# Patient Record
Sex: Female | Born: 1960 | Race: Black or African American | Hispanic: No | Smoking: Never smoker
Health system: Southern US, Community
[De-identification: ages and names within clinical notes are randomized; demographics above are authoritative.]

## PROBLEM LIST (undated history)

## (undated) DIAGNOSIS — I10 Essential (primary) hypertension: Secondary | ICD-10-CM

## (undated) HISTORY — PX: DILATION AND CURETTAGE OF UTERUS: SHX78

## (undated) HISTORY — PX: TONSILLECTOMY: SUR1361

---

## 2014-09-07 ENCOUNTER — Emergency Department (HOSPITAL_BASED_OUTPATIENT_CLINIC_OR_DEPARTMENT_OTHER): Payer: BLUE CROSS/BLUE SHIELD

## 2014-09-07 ENCOUNTER — Other Ambulatory Visit: Payer: Self-pay

## 2014-09-07 ENCOUNTER — Encounter (HOSPITAL_BASED_OUTPATIENT_CLINIC_OR_DEPARTMENT_OTHER): Payer: Self-pay

## 2014-09-07 ENCOUNTER — Emergency Department (HOSPITAL_BASED_OUTPATIENT_CLINIC_OR_DEPARTMENT_OTHER)
Admission: EM | Admit: 2014-09-07 | Discharge: 2014-09-07 | Disposition: A | Discharge status: Home / Self Care | Payer: BLUE CROSS/BLUE SHIELD | Attending: Emergency Medicine | Admitting: Emergency Medicine

## 2014-09-07 DIAGNOSIS — E669 Obesity, unspecified: Secondary | ICD-10-CM | POA: Diagnosis not present

## 2014-09-07 DIAGNOSIS — R0789 Other chest pain: Secondary | ICD-10-CM | POA: Insufficient documentation

## 2014-09-07 DIAGNOSIS — R079 Chest pain, unspecified: Secondary | ICD-10-CM | POA: Diagnosis present

## 2014-09-07 HISTORY — DX: Essential (primary) hypertension: I10

## 2014-09-07 LAB — CBC WITH DIFFERENTIAL/PLATELET
BASOS PCT: 1 % (ref 0–1)
Basophils Absolute: 0 10*3/uL (ref 0.0–0.1)
EOS ABS: 0.7 10*3/uL (ref 0.0–0.7)
Eosinophils Relative: 13 % — ABNORMAL HIGH (ref 0–5)
HCT: 37.1 % (ref 36.0–46.0)
HEMOGLOBIN: 12.3 g/dL (ref 12.0–15.0)
LYMPHS ABS: 1.6 10*3/uL (ref 0.7–4.0)
LYMPHS PCT: 31 % (ref 12–46)
MCH: 29.5 pg (ref 26.0–34.0)
MCHC: 33.2 g/dL (ref 30.0–36.0)
MCV: 89 fL (ref 78.0–100.0)
MONO ABS: 0.4 10*3/uL (ref 0.1–1.0)
Monocytes Relative: 8 % (ref 3–12)
NEUTROS PCT: 47 % (ref 43–77)
Neutro Abs: 2.4 10*3/uL (ref 1.7–7.7)
PLATELETS: 285 10*3/uL (ref 150–400)
RBC: 4.17 MIL/uL (ref 3.87–5.11)
RDW: 12.3 % (ref 11.5–15.5)
WBC: 5.1 10*3/uL (ref 4.0–10.5)

## 2014-09-07 LAB — BASIC METABOLIC PANEL
ANION GAP: 5 (ref 5–15)
BUN: 15 mg/dL (ref 6–20)
CALCIUM: 8.8 mg/dL — AB (ref 8.9–10.3)
CO2: 27 mmol/L (ref 22–32)
CREATININE: 0.69 mg/dL (ref 0.44–1.00)
Chloride: 107 mmol/L (ref 101–111)
GFR calc non Af Amer: >60 mL/min (ref >60)
Glucose, Bld: 107 mg/dL — ABNORMAL HIGH (ref 65–99)
POTASSIUM: 3.9 mmol/L (ref 3.5–5.1)
Sodium: 139 mmol/L (ref 135–145)

## 2014-09-07 LAB — TROPONIN I
Troponin I: <0.03 ng/mL (ref <0.031)
Troponin I: <0.03 ng/mL (ref <0.031)

## 2014-09-07 NOTE — ED Notes (Signed)
MD at bedside. 

## 2014-09-07 NOTE — Discharge Instructions (Signed)
Chest Pain (Nonspecific) Call your primary care physician to schedule a follow-up visit within the next one or 2 weeks. Return if concerned for any reason It is often hard to give a diagnosis for the cause of chest pain. There is always a chance that your pain could be related to something serious, such as a heart attack or a blood clot in the lungs. You need to follow up with your doctor. HOME CARE  If antibiotic medicine was given, take it as directed by your doctor. Finish the medicine even if you start to feel better.  For the next few days, avoid activities that bring on chest pain. Continue physical activities as told by your doctor.  Do not use any tobacco products. This includes cigarettes, chewing tobacco, and e-cigarettes.  Avoid drinking alcohol.  Only take medicine as told by your doctor.  Follow your doctor's suggestions for more testing if your chest pain does not go away.  Keep all doctor visits you made. GET HELP IF:  Your chest pain does not go away, even after treatment.  You have a rash with blisters on your chest.  You have a fever. GET HELP RIGHT AWAY IF:   You have more pain or pain that spreads to your arm, neck, jaw, back, or belly (abdomen).  You have shortness of breath.  You cough more than usual or cough up blood.  You have very bad back or belly pain.  You feel sick to your stomach (nauseous) or throw up (vomit).  You have very bad weakness.  You pass out (faint).  You have chills. This is an emergency. Do not wait to see if the problems will go away. Call your local emergency services (911 in [place] Do not drive yourself to the hospital. MAKE SURE YOU:   Understand these instructions.  Will watch your condition.  Will get help right away if you are not doing well or get worse. Document Released: 09/11/2007 Document Revised: 03/30/2013 Document Reviewed: 09/11/2007 [facility] Patient Information 2015 [place], [place]. This information is  not intended to replace advice given to you by your health care provider. Make sure you discuss any questions you have with your health care provider.

## 2014-09-07 NOTE — ED Provider Notes (Signed)
CSN: [id number]     Arrival date & time 09/07/14  [id number] History   First MD Initiated Contact with Patient 09/07/14 0935     Chief Complaint  Patient presents with  . Chest Pain     (Consider location/radiation/quality/duration/timing/severity/associated sxs/prior Treatment) HPI Complains of anterior chest pain rating to left arm onset 4 days ago, intermittent  But lasting all day yesterday. Awakened with pain this morning. But presently pain-free. She treated herself with aspirin, and TUMS, no relief. Pain is worse with stress. Nonexertional.. No other associated symptoms no associated shortness of breath no nausea no sweatiness. Presently feels a mild vague discomfort at substernal area which has been constant since awakening this morning No past medical history on file. No past surgical history on file. past medical history hypertension No family history on file. History  Substance Use Topics  . Smoking status: Not on file  . Smokeless tobacco: Not on file  . Alcohol Use: Not on file   ex-smoker quit 25 years ago no alcohol no drugs OB History    No data available     Review of Systems  Constitutional: Negative.   HENT: Negative.   Respiratory: Negative.   Cardiovascular: Positive for chest pain.  Gastrointestinal: Negative.   Musculoskeletal: Negative.   Skin: Negative.   Neurological: Negative.   Psychiatric/Behavioral: Negative.   All other systems reviewed and are negative.     Allergies  Review of patient's allergies indicates not on file.  Home Medications   Prior to Admission medications   Not on File   There were no vitals taken for this visit. Physical Exam  Constitutional: She appears well-developed and well-nourished.  HENT:  Head: Normocephalic and atraumatic.  Eyes: Conjunctivae are normal. Pupils are equal, round, and reactive to light.  Neck: Neck supple. No tracheal deviation present. No thyromegaly present.  Cardiovascular: Normal rate and  regular rhythm.   No murmur heard. Pulmonary/Chest: Effort normal and breath sounds normal.  Abdominal: Soft. Bowel sounds are normal. She exhibits no distension. There is no tenderness.  Obese  Musculoskeletal: Normal range of motion. She exhibits no edema or tenderness.  Neurological: She is alert. Coordination normal.  Skin: Skin is warm and dry. No rash noted.  Psychiatric: She has a normal mood and affect.  Nursing note and vitals reviewed.   ED Course  Procedures (including critical care time) Labs Review Labs Reviewed - No data to display  Imaging Review No results found.   EKG Interpretation   Date/Time:  Wednesday September 07 2014 09:41:44 EDT Ventricular Rate:  93 PR Interval:  146 QRS Duration: 86 QT Interval:  364 QTC Calculation: 452 R Axis:   21 Text Interpretation:  Normal sinus rhythm Normal ECG No old tracing to  compare Confirmed by Ethelda ChickJACUBOWITZ  MD, Greta Yung [phone]([id number]) on 09/07/2014 9:47:19 AM     1 PM continues to feel a mild vague substernal discomfort which she states has been present since awakening this morning. She declines pain medicine and states she is reasonably comfortable. Chest x-ray viewed by me. Results for orders placed or performed during the hospital encounter of 09/07/14  Troponin I  Result Value Ref Range   Troponin I <0.03 <0.031 ng/mL  Troponin I  Result Value Ref Range   Troponin I <0.03 <0.031 ng/mL  CBC with Differential/Platelet  Result Value Ref Range   WBC 5.1 4.0 - 10.5 K/uL   RBC 4.17 3.87 - 5.11 MIL/uL   Hemoglobin 12.3 12.0 - 15.0 g/dL  HCT 37.1 36.0 - 46.0 %   MCV 89.0 78.0 - 100.0 fL   MCH 29.5 26.0 - 34.0 pg   MCHC 33.2 30.0 - 36.0 g/dL   RDW 04.5 40.9 - 81.1 %   Platelets 285 150 - 400 K/uL   Neutrophils Relative % 47 43 - 77 %   Neutro Abs 2.4 1.7 - 7.7 K/uL   Lymphocytes Relative 31 12 - 46 %   Lymphs Abs 1.6 0.7 - 4.0 K/uL   Monocytes Relative 8 3 - 12 %   Monocytes Absolute 0.4 0.1 - 1.0 K/uL   Eosinophils  Relative 13 (H) 0 - 5 %   Eosinophils Absolute 0.7 0.0 - 0.7 K/uL   Basophils Relative 1 0 - 1 %   Basophils Absolute 0.0 0.0 - 0.1 K/uL  Basic metabolic panel  Result Value Ref Range   Sodium 139 135 - 145 mmol/L   Potassium 3.9 3.5 - 5.1 mmol/L   Chloride 107 101 - 111 mmol/L   CO2 27 22 - 32 mmol/L   Glucose, Bld 107 (H) 65 - 99 mg/dL   BUN 15 6 - 20 mg/dL   Creatinine, Ser 9.14 0.44 - 1.00 mg/dL   Calcium 8.8 (L) 8.9 - 10.3 mg/dL   GFR calc non Af Amer >60 >60 mL/min   GFR calc Af Amer >60 >60 mL/min   Anion gap 5 5 - 15   Dg Chest 2 View  09/07/2014   CLINICAL DATA:  Mid to LEFT side chest pain since 09/03/2014, history hypertension  EXAM: CHEST  2 VIEW  COMPARISON:  None  FINDINGS: Upper normal heart size.  Normal mediastinal contours and pulmonary vascularity.  Central peribronchial thickening.  No infiltrate, pleural effusion or pneumothorax.  Bones unremarkable.  IMPRESSION: Bronchitic changes without infiltrate.   Electronically Signed   By: Ulyses Southward M.D.   On: 09/07/2014 10:01    MDM  Pain felt to be atypical for acute coronary syndrome. Heart score equals 2 based on age and risk factors Final diagnoses:  None   plan follow-up with PMD Diagnosis atypical chest pain      Doug Sou, MD 09/07/14 1311

## 2014-09-07 NOTE — ED Notes (Signed)
Midsternal chest pain that developed Saturday and relieved after taking 2 doses of Aspirin.  States she has had intermittent chest pain since and chest pain awakened ger this am.  Pain is described as "indigestion, and radiates to left arm.  Took Tums and Pepto Bismal without relief.  Denies SOB, diaphoresis, dizziness, nausea/vomiting.  No pain at present time, however 6/10 at the worst.

## 2015-06-07 DIAGNOSIS — I10 Essential (primary) hypertension: Secondary | ICD-10-CM

## 2015-06-07 HISTORY — DX: Essential (primary) hypertension: I10

## 2015-07-17 DIAGNOSIS — E559 Vitamin D deficiency, unspecified: Secondary | ICD-10-CM

## 2015-07-17 HISTORY — DX: Vitamin D deficiency, unspecified: E55.9

## 2016-03-25 DIAGNOSIS — E782 Mixed hyperlipidemia: Secondary | ICD-10-CM | POA: Insufficient documentation

## 2016-03-25 HISTORY — DX: Mixed hyperlipidemia: E78.2

## 2016-05-18 ENCOUNTER — Encounter (HOSPITAL_BASED_OUTPATIENT_CLINIC_OR_DEPARTMENT_OTHER): Payer: Self-pay | Admitting: *Deleted

## 2016-05-18 ENCOUNTER — Emergency Department (HOSPITAL_BASED_OUTPATIENT_CLINIC_OR_DEPARTMENT_OTHER)
Admission: EM | Admit: 2016-05-18 | Discharge: 2016-05-18 | Disposition: A | Discharge status: Home / Self Care | Payer: BLUE CROSS/BLUE SHIELD | Attending: Emergency Medicine | Admitting: Emergency Medicine

## 2016-05-18 ENCOUNTER — Emergency Department (HOSPITAL_BASED_OUTPATIENT_CLINIC_OR_DEPARTMENT_OTHER): Payer: BLUE CROSS/BLUE SHIELD

## 2016-05-18 DIAGNOSIS — Z79899 Other long term (current) drug therapy: Secondary | ICD-10-CM | POA: Insufficient documentation

## 2016-05-18 DIAGNOSIS — M25511 Pain in right shoulder: Secondary | ICD-10-CM | POA: Insufficient documentation

## 2016-05-18 DIAGNOSIS — I10 Essential (primary) hypertension: Secondary | ICD-10-CM | POA: Diagnosis not present

## 2016-05-18 MED ORDER — HYDROCODONE-ACETAMINOPHEN 5-325 MG PO TABS: 1.0000 | ORAL_TABLET | Freq: Four times a day (QID) | ORAL | 0 refills | Status: DC | PRN

## 2016-05-18 MED ORDER — HYDROCODONE-ACETAMINOPHEN 5-325 MG PO TABS: 1.0000 | ORAL_TABLET | Freq: Once | ORAL | Status: DC

## 2016-05-18 MED ORDER — HYDROCODONE-ACETAMINOPHEN 5-325 MG PO TABS
1.0000 | ORAL_TABLET | Freq: Once | ORAL | Status: AC
  Administered 2016-05-18: 1 via ORAL
  Filled 2016-05-18: qty 1

## 2016-05-18 NOTE — ED Triage Notes (Signed)
Pt reports pain in right shoulder since trying to sit up last night from a chair.  Reports increased pain on movement, reports inability to lift arm.  No deformity noted. Pt tender on palpation.

## 2016-05-18 NOTE — ED Provider Notes (Signed)
[facility] Provider Note   CSN: [id number] Arrival date & time: 05/18/16  1512  By signing my name below, I, Modena JanskyAlbert Thayil, attest that this documentation has been prepared under the direction and in the presence of non-physician practitioner, Azucena Kubayler Leaphart, PA-C. Electronically Signed: Modena JanskyAlbert Thayil, Scribe. 05/18/2016. 5:44 PM.  History   Chief Complaint Chief Complaint  Patient presents with  . Arm Pain   The history is provided by the patient. No language interpreter was used.   HPI Comments: Stephanie Waters is a 56 y.o. female who presents to the Emergency Department complaining of constant moderate right shoulder pain that started last night. She suddenly was unable to move her RUE (due to pain) while sitting at her desk on the computer. She has tried motrin without relief. Her pain is exacerbated by movement. Starts in her right shoulder and radiates to the right bicep. She allergic to codeine and morphine. She denies any prior hx of similar complaint, GI symptoms, or other complaints.   Past Medical History:  Diagnosis Date  . Hypertension     There are no active problems to display for this patient.   Past Surgical History:  Procedure Laterality Date  . DILATION AND CURETTAGE OF UTERUS      OB History    No data available       Home Medications    Prior to Admission medications   Medication Sig Start Date End Date Taking? Authorizing Provider  VALSARTAN PO Take by mouth.    Historical Provider, MD    Family History History reviewed. No pertinent family history.  Social History Social History  Substance Use Topics  . Smoking status: Never Smoker  . Smokeless tobacco: Not on file  . Alcohol use No     Allergies   Codeine and Morphine and related   Review of Systems Review of Systems  Gastrointestinal: Negative for abdominal pain, diarrhea, nausea and vomiting.  Musculoskeletal: Positive for myalgias (Right shoulder).  All other  systems reviewed and are negative.    Physical Exam Updated Vital Signs BP 177/93 (BP Location: Left Arm)   Pulse 84   Temp 98.4 F (36.9 C) (Oral)   Resp 20   Ht 5\' 5"  (1.651 m)   Wt 230 lb (104.3 kg)   SpO2 100%   BMI 38.27 kg/m   Physical Exam  Constitutional: She appears well-developed and well-nourished. No distress.  HENT:  Head: Normocephalic.  Eyes: Conjunctivae are normal.  Neck: Neck supple.  Cardiovascular: Normal rate and regular rhythm.   Pulmonary/Chest: Effort normal.  Abdominal: Soft.  Musculoskeletal:       Right shoulder: She exhibits decreased range of motion (Due to pain), tenderness (Over the joint), bony tenderness and pain. She exhibits no swelling, no effusion, no crepitus, no deformity, no spasm, normal pulse and normal strength.  Full ROM of the right elbow without pain. Normal mucle tone of the bicep. Pt able to slightly abduct and adduct the shoulder. Pain in anterior shoulder.   Neurological: She is alert.  Skin: Skin is warm and dry.  Psychiatric: She has a normal mood and affect.  Nursing note and vitals reviewed.    ED Treatments / Results  DIAGNOSTIC STUDIES: Oxygen Saturation is 100% on RA, normal by my interpretation.    COORDINATION OF CARE: 5:48 PM- Pt advised of plan for treatment and pt agrees.  Labs (all labs ordered are listed, but only abnormal results are displayed) Labs Reviewed - No data to display  EKG  EKG Interpretation None       Radiology Dg Shoulder Right  Result Date: 05/18/2016 CLINICAL DATA:  Right shoulder pain after sitting up last night. EXAM: RIGHT SHOULDER - 2+ VIEW COMPARISON:  None. FINDINGS: No acute bony abnormality. Specifically, no fracture, subluxation, or dislocation. Soft tissues are intact. Joint spaces maintained. IMPRESSION: Negative. Electronically Signed   By: Charlett Nose M.D.   On: 05/18/2016 16:04    Procedures Procedures (including critical care time)  Medications Ordered in  ED Medications  HYDROcodone-acetaminophen (NORCO/VICODIN) 5-325 MG per tablet 1 tablet (1 tablet Oral Given 05/18/16 1818)     Initial Impression / Assessment and Plan / ED Course  I have reviewed the triage vital signs and the nursing notes.  Pertinent labs & imaging results that were available during my care of the patient were reviewed by me and considered in my medical decision making (see chart for details).     Patient X-Ray negative for obvious fracture or dislocation. Pain managed in ED. Dx diagnosis includes rotator cuff tear or bicep tendon rupture. Do not appreciate any bulging or edema I in the bicep region. Pt advised to follow up with orthopedics if symptoms persist for possibility of missed fracture diagnosis. Patient given brace while in ED, conservative therapy recommended and discussed. Patient will be dc home & is agreeable with above plan.   Final Clinical Impressions(s) / ED Diagnoses   Final diagnoses:  Acute pain of right shoulder    New Prescriptions Discharge Medication List as of 05/18/2016  6:13 PM    START taking these medications   Details  HYDROcodone-acetaminophen (NORCO) 5-325 MG tablet Take 1 tablet by mouth every 6 (six) hours as needed., Starting Sat 05/18/2016, Print       I personally performed the services described in this documentation, which was scribed in my presence. The recorded information has been reviewed and is accurate.     Rise Mu, PA-C 05/20/16 1521    Rolland Porter, MD 05/26/16 [phone]

## 2016-05-18 NOTE — Discharge Instructions (Signed)
Your x-ray was normal. This could be a rotator cuff problem or a bicep tendon problem. Please wear the sling for comfort. Take the pain medicine as needed. You may also take Motrin at home. Have given you a referral to Dr. Pearletha ForgeHudnall who is an orthopedist. Call them on Monday for an appointment. Return to ED develop any worsening symptoms. I would also encourage her to apply ice and heat for comfort.

## 2017-01-24 HISTORY — DX: Morbid (severe) obesity due to excess calories: E66.01

## 2017-01-29 DIAGNOSIS — Z8249 Family history of ischemic heart disease and other diseases of the circulatory system: Secondary | ICD-10-CM | POA: Insufficient documentation

## 2017-01-29 HISTORY — DX: Family history of ischemic heart disease and other diseases of the circulatory system: Z82.49

## 2017-03-10 ENCOUNTER — Emergency Department (HOSPITAL_BASED_OUTPATIENT_CLINIC_OR_DEPARTMENT_OTHER): Payer: Managed Care, Other (non HMO)

## 2017-03-10 ENCOUNTER — Emergency Department (HOSPITAL_BASED_OUTPATIENT_CLINIC_OR_DEPARTMENT_OTHER)
Admission: EM | Admit: 2017-03-10 | Discharge: 2017-03-10 | Disposition: A | Discharge status: Home / Self Care | Payer: Managed Care, Other (non HMO) | Attending: Emergency Medicine | Admitting: Emergency Medicine

## 2017-03-10 ENCOUNTER — Encounter (HOSPITAL_BASED_OUTPATIENT_CLINIC_OR_DEPARTMENT_OTHER): Payer: Self-pay | Admitting: *Deleted

## 2017-03-10 ENCOUNTER — Other Ambulatory Visit: Payer: Self-pay

## 2017-03-10 DIAGNOSIS — S8392XA Sprain of unspecified site of left knee, initial encounter: Secondary | ICD-10-CM | POA: Insufficient documentation

## 2017-03-10 DIAGNOSIS — I10 Essential (primary) hypertension: Secondary | ICD-10-CM | POA: Diagnosis not present

## 2017-03-10 DIAGNOSIS — W01198A Fall on same level from slipping, tripping and stumbling with subsequent striking against other object, initial encounter: Secondary | ICD-10-CM | POA: Insufficient documentation

## 2017-03-10 DIAGNOSIS — S8391XA Sprain of unspecified site of right knee, initial encounter: Secondary | ICD-10-CM | POA: Insufficient documentation

## 2017-03-10 DIAGNOSIS — S8992XA Unspecified injury of left lower leg, initial encounter: Secondary | ICD-10-CM | POA: Diagnosis present

## 2017-03-10 DIAGNOSIS — S8991XA Unspecified injury of right lower leg, initial encounter: Secondary | ICD-10-CM | POA: Diagnosis not present

## 2017-03-10 DIAGNOSIS — Y9289 Other specified places as the place of occurrence of the external cause: Secondary | ICD-10-CM | POA: Insufficient documentation

## 2017-03-10 DIAGNOSIS — Y9301 Activity, walking, marching and hiking: Secondary | ICD-10-CM | POA: Diagnosis not present

## 2017-03-10 DIAGNOSIS — Y998 Other external cause status: Secondary | ICD-10-CM | POA: Insufficient documentation

## 2017-03-10 MED ORDER — ACETAMINOPHEN 325 MG PO TABS: 650.0000 mg | ORAL_TABLET | Freq: Once | ORAL | Status: DC

## 2017-03-10 NOTE — Discharge Instructions (Signed)
You can take Tylenol or Ibuprofen as directed for pain. You can alternate Tylenol and Ibuprofen every 4 hours. If you take Tylenol at 1pm, then you can take Ibuprofen at 5pm. Then you can take Tylenol again at 9pm.   Follow the RICE (Rest, Ice, Compression, Elevation) protocol as directed.   Follow-up with referred orthopedic doctor if symptoms do not improve within the next 1-2 weeks.  Return to the emergency department for any worsening pain, redness or swelling of the knees, numbness/weakness of the legs, difficulty walking or any other worsening or concerning symptoms.

## 2017-03-10 NOTE — ED Provider Notes (Signed)
[facility] EMERGENCY DEPARTMENT Provider Note   CSN: [id number] Arrival date & time: 03/10/17  1052     History   Chief Complaint Chief Complaint  Patient presents with  . Fall  . Knee Pain    HPI Stephanie Waters is a 56 y.o. female to the ED for evaluation of bilateral knee pain after mechanical fall that occurred last night.  Patient reports that she was walking out onto a wet deck when she slipped and fell, causing her knees to fall and hit the ground and spread out.  Patient reports that she did not hit her head or lose consciousness.  She was able to get up immediately after the incident.  Patient reports that since then she has had pain to the mid medial aspect of the bilateral knees.  She is taken Tylenol with minimal improvement.  Her last dose was approximately 12 AM last night.  Patient states that she has been able to walk but states that ambulating exacerbates her pain.  Patient denies any fevers, numbness/weakness, vision changes, neck pain.  The history is provided by the patient.    Past Medical History:  Diagnosis Date  . Hypertension     There are no active problems to display for this patient.   Past Surgical History:  Procedure Laterality Date  . DILATION AND CURETTAGE OF UTERUS    . TONSILLECTOMY      OB History    No data available       Home Medications    Prior to Admission medications   Medication Sig Start Date End Date Taking? Authorizing Provider  HYDROCHLOROTHIAZIDE PO Take by mouth.   Yes [provider]  VALSARTAN PO Take by mouth.   Yes [provider]  HYDROcodone-acetaminophen (NORCO) 5-325 MG tablet Take 1 tablet by mouth every 6 (six) hours as needed. 05/18/16   Rise MuLeaphart, Kenneth T, PA-C    Family History No family history on file.  Social History Social History   Tobacco Use  . Smoking status: Never Smoker  . Smokeless tobacco: Never Used  Substance Use Topics  . Alcohol use: Yes  . Drug  use: No     Allergies   Codeine and Morphine and related   Review of Systems Review of Systems  Eyes: Negative for visual disturbance.  Musculoskeletal: Negative for back pain and neck pain.       Bilateral knee pain     Physical Exam Updated Vital Signs BP (!) 145/96   Pulse 85   Temp 97.8 F (36.6 C) (Oral)   Resp 20   Ht 5\' 5"  (1.651 m)   Wt 98.9 kg (218 lb)   SpO2 100%   BMI 36.28 kg/m   Physical Exam  Constitutional: She appears well-developed and well-nourished.  HENT:  Head: Normocephalic and atraumatic.  Eyes: Conjunctivae and EOM are normal. Pupils are equal, round, and reactive to light. Right eye exhibits no discharge. Left eye exhibits no discharge. No scleral icterus.  PERRL  Cardiovascular:  Pulses:      Dorsalis pedis pulses are 2+ on the right side, and 2+ on the left side.  Pulmonary/Chest: Effort normal.  Musculoskeletal:  Tenderness palpation to the medial aspect of the left knee.  There is mild overlying soft tissue swelling.  No deformity or crepitus noted.  Negative posterior and anterior drawer test.  Patient does have some laxity noted on valgus stress.  Flexion/extension of left lower extremity intact without difficulty.  Patient is able  to hold the left knee in extension against gravity.  Tenderness to palpation the medial aspect of the right knee.  Negative anterior and posterior drawer test.  Minimal laxity noted on valgus stress.  No varus laxity.  Flexion/extension intact without difficulty.  No tenderness palpation to the bilateral ankles.  Neurological: She is alert.  Skin: Skin is warm and dry.  Psychiatric: She has a normal mood and affect. Her speech is normal and behavior is normal.  Nursing note and vitals reviewed.    ED Treatments / Results  Labs (all labs ordered are listed, but only abnormal results are displayed) Labs Reviewed - No data to display  EKG  EKG Interpretation None       Radiology Dg Knee Complete 4  Views Left  Result Date: 03/10/2017 CLINICAL DATA:  Bilateral knee pain after fall last night. EXAM: LEFT KNEE - COMPLETE 4+ VIEW COMPARISON:  None. FINDINGS: No evidence of fracture, dislocation, or joint effusion. No evidence of arthropathy. Probable osteochondroma arises medially from distal femur. Soft tissues are unremarkable. IMPRESSION: Probable osteochondroma arises medially from distal femur. No acute abnormality seen in the left knee. Electronically Signed   By: Lupita RaiderJames  Green Jr, M.D.   On: 03/10/2017 11:45   Dg Knee Complete 4 Views Right  Result Date: 03/10/2017 CLINICAL DATA:  Bilateral knee pain after fall last night. EXAM: RIGHT KNEE - COMPLETE 4+ VIEW COMPARISON:  None. FINDINGS: No evidence of fracture, dislocation, or joint effusion. No evidence of arthropathy or other focal bone abnormality. Soft tissues are unremarkable. IMPRESSION: Normal right knee. Electronically Signed   By: Lupita RaiderJames  Green Jr, M.D.   On: 03/10/2017 11:46    Procedures Procedures (including critical care time)  Medications Ordered in ED Medications  acetaminophen (TYLENOL) tablet 650 mg (not administered)     Initial Impression / Assessment and Plan / ED Course  I have reviewed the triage vital signs and the nursing notes.  Pertinent labs & imaging results that were available during my care of the patient were reviewed by me and considered in my medical decision making (see chart for details).     56 year old female who presents today for evaluation of bilateral knee pain that began after mechanical fall last night.  Patient has been able to ambulate but reports worsening pain with ambulation.  No numbness/weakness.  No head injury. Patient is afebrile, non-toxic appearing, sitting comfortably on examination table. Vital signs reviewed and stable.  Patient is slightly hypertensive, likely secondary to pain.  Physical exam shows tenderness palpation to the medial aspect of bilateral knees, left greater  than right.  There is some minimal laxity noted on valgus stress of bilateral knees.  Negative anterior and posterior drawer test.  Initial x-rays images orders at triage.  Review of x-rays.  Right knee x-ray is without any acute abnormalities.  Left x-ray shows osteochondroma but no evidence of dislocation or fracture.  We will plan to treat as knee sprain.  Discussed results with patient.  Discussed conservative at home therapies.  We will plan to provide knee immobilizer and crutches for support of pain relief. Plan to provide outpatient orthopedic referral for further follow-up and evaluation. Instructed patient to follow-up with PCP in 2 days. Strict return precautions discussed. Patient expresses understanding and agreement to plan.   Final Clinical Impressions(s) / ED Diagnoses   Final diagnoses:  Sprain of left knee, unspecified ligament, initial encounter  Sprain of right knee, unspecified ligament, initial encounter    ED Discharge  Orders    None       Rosana Hoes 03/10/17 2153    Lavera Guise, MD 03/13/17 2204

## 2017-03-10 NOTE — ED Notes (Signed)
ED Provider at bedside. 

## 2017-03-10 NOTE — ED Triage Notes (Signed)
She slipped on wet leaves last night. Pain in both knees.

## 2017-03-14 ENCOUNTER — Encounter: Payer: Self-pay | Admitting: Family Medicine

## 2017-03-14 ENCOUNTER — Ambulatory Visit (INDEPENDENT_AMBULATORY_CARE_PROVIDER_SITE_OTHER): Payer: Self-pay | Admitting: Family Medicine

## 2017-03-14 DIAGNOSIS — M25562 Pain in left knee: Secondary | ICD-10-CM

## 2017-03-14 DIAGNOSIS — M25561 Pain in right knee: Secondary | ICD-10-CM

## 2017-03-14 NOTE — Patient Instructions (Signed)
You have knee contusions, severe on the left. Your exam is reassuring otherwise. Ice the knees 15 minutes at a time 3-4 times a day. Ibuprofen 600mg  three times a day with food for pain and inflammation. Ok to take tylenol as needed in addition to this. You can stop using the immobilizer - an ace wrap, sleeve, or knee brace is ok to use if these feel more supportive but is not necessary. Elevate above your heart when needed. Straight leg raises, knee extensions 3 sets of 10 once a day - add ankle weight if these become too easy. Follow up with me in 2 weeks for reevaluation.

## 2017-03-18 ENCOUNTER — Encounter: Payer: Self-pay | Admitting: Family Medicine

## 2017-03-18 DIAGNOSIS — M25562 Pain in left knee: Secondary | ICD-10-CM

## 2017-03-18 DIAGNOSIS — M25561 Pain in right knee: Secondary | ICD-10-CM | POA: Insufficient documentation

## 2017-03-18 NOTE — Progress Notes (Signed)
PCP: [facility]  Subjective:   HPI: Patient is a 56 y.o. female here for bilateral knee pain.  Patient reports on 12/2 she slipped on wet leaves on her deck and did a side split with both legs. Caused her to strike both knees medially on the deck. Pain sharp and severe both knees medially - 6/10 left, 5/10 on right. Has been wearing an immobilizer on left knee, taking ibuprofen which helps. Iced some up until Wednesday. No prior injuries. No skin changes, numbness.  Past Medical History:  Diagnosis Date  . Hypertension     Current Outpatient Medications on File Prior to Visit  Medication Sig Dispense Refill  . valsartan-hydrochlorothiazide (DIOVAN-HCT) 320-25 MG tablet valsartan 320 mg-hydrochlorothiazide 25 mg tablet    . aspirin EC 81 MG tablet Take by mouth.    Marland Kitchen. HYDROcodone-acetaminophen (NORCO) 5-325 MG tablet Take 1 tablet by mouth every 6 (six) hours as needed. 7 tablet 0  . losartan-hydrochlorothiazide (HYZAAR) 100-25 MG tablet     . medroxyPROGESTERone (PROVERA) 10 MG tablet medroxyprogesterone 10 mg tablet    . metoprolol succinate (TOPROL-XL) 50 MG 24 hr tablet      No current facility-administered medications on file prior to visit.     Past Surgical History:  Procedure Laterality Date  . DILATION AND CURETTAGE OF UTERUS    . TONSILLECTOMY      Allergies  Allergen Reactions  . Codeine Shortness Of Breath and Rash  . Morphine And Related Palpitations    tachycardia    Social History   Socioeconomic History  . Marital status: Legally Separated    Spouse name: Not on file  . Number of children: Not on file  . Years of education: Not on file  . Highest education level: Not on file  Social Needs  . Financial resource strain: Not on file  . Food insecurity - worry: Not on file  . Food insecurity - inability: Not on file  . Transportation needs - medical: Not on file  . Transportation needs - non-medical: Not on file   Occupational History  . Not on file  Tobacco Use  . Smoking status: Never Smoker  . Smokeless tobacco: Never Used  Substance and Sexual Activity  . Alcohol use: Yes  . Drug use: No  . Sexual activity: Not on file  Other Topics Concern  . Not on file  Social History Narrative  . Not on file    History reviewed. No pertinent family history.  BP (!) 146/100   Pulse 78   Ht 5\' 5"  (1.651 m)   Wt 218 lb (98.9 kg)   BMI 36.28 kg/m   Review of Systems: See HPI above.     Objective:  Physical Exam:  Gen: NAD, comfortable in exam room  Left knee: No gross deformity, ecchymoses, swelling. Mod tenderness medial joint line.  No other tenderness. FROM with 5/5 strength. Negative ant/post drawers. Negative valgus/varus testing. Negative lachmanns. Negative mcmurrays, apleys, patellar apprehension. NV intact distally.  Right knee: No gross deformity, ecchymoses, swelling. Mild TTP medial joint line.  No other tenderness. FROM with 5/5 strength. Negative ant/post drawers. Negative valgus/varus testing. Negative lachmanns. Negative mcmurrays, apleys, patellar apprehension. NV intact distally.   Assessment & Plan:  1. Bilateral knee pain - Independently reviewed radiographs and no evidence fracture.  Exam of both knees reassuring, consistent with severe contusions.  Icing, ibuprofen tid.  Tylenol if needed.  Stop immobilizer on left knee - use ace wrap or  sleeve instead.  Shown home exercises to do daily.  F/u in 2 weeks for reevaluation.

## 2017-03-18 NOTE — Assessment & Plan Note (Signed)
Independently reviewed radiographs and no evidence fracture.  Exam of both knees reassuring, consistent with severe contusions.  Icing, ibuprofen tid.  Tylenol if needed.  Stop immobilizer on left knee - use ace wrap or sleeve instead.  Shown home exercises to do daily.  F/u in 2 weeks for reevaluation.

## 2017-03-28 ENCOUNTER — Ambulatory Visit: Payer: Self-pay | Admitting: Family Medicine

## 2017-04-09 ENCOUNTER — Ambulatory Visit: Payer: Managed Care, Other (non HMO) | Admitting: Family Medicine

## 2017-04-09 ENCOUNTER — Encounter: Payer: Self-pay | Admitting: Family Medicine

## 2017-04-09 DIAGNOSIS — M25552 Pain in left hip: Secondary | ICD-10-CM | POA: Diagnosis not present

## 2017-04-09 DIAGNOSIS — M25561 Pain in right knee: Secondary | ICD-10-CM | POA: Diagnosis not present

## 2017-04-09 DIAGNOSIS — M25562 Pain in left knee: Secondary | ICD-10-CM | POA: Diagnosis not present

## 2017-04-09 HISTORY — DX: Pain in left hip: M25.552

## 2017-04-09 NOTE — Assessment & Plan Note (Signed)
2/2 contusions.  Much improved with only mild medial pain on left now.  Icing, ibuprofen only if needed.  Continue home exercises.  F/u in 4 weeks.

## 2017-04-09 NOTE — Assessment & Plan Note (Signed)
2/2 adductor strain.  Pain severity high with walking.  Rest of hip exam is reassuring and no new injuries.  Shown home exercises to do daily.  Icing, ibuprofen.  F/u in 4 weeks.  Consider imaging, physical therapy if not improving as expected.

## 2017-04-09 NOTE — Patient Instructions (Addendum)
You have knee contusions that are improving. Take ibuprofen only if needed. Elevate above your heart when needed. Straight leg raises, knee extensions 3 sets of 10 once a day - add ankle weight if these become too easy. You also have a left groin strain. Icing 15 minutes at a time 3-4 times a day if needed. Do adductor raises 3 sets of 10 once a day. Consider physical therapy if not improving. Follow up with me in 4 weeks.

## 2017-04-09 NOTE — Progress Notes (Signed)
PCP: [facility]  Subjective:   HPI: Patient is a 57 y.o. female here for bilateral knee pain.  03/14/17: Patient reports on 12/2 she slipped on wet leaves on her deck and did a side split with both legs. Caused her to strike both knees medially on the deck. Pain sharp and severe both knees medially - 6/10 left, 5/10 on right. Has been wearing an immobilizer on left knee, taking ibuprofen which helps. Iced some up until Wednesday. No prior injuries. No skin changes, numbness.  04/09/17: Patient reports her knees are improving. Biggest problem is medial left hip/groin. Developed pain here about a couple days after last visit - didn't notice this before then. Pain is 0/10 at rest but up to 7/10 and sharp at times with walking. Worse with sitting to standing, in the morning. No skin changes, numbness.  Past Medical History:  Diagnosis Date  . Hypertension     Current Outpatient Medications on File Prior to Visit  Medication Sig Dispense Refill  . aspirin EC 81 MG tablet Take by mouth.    . losartan-hydrochlorothiazide (HYZAAR) 100-25 MG tablet     . medroxyPROGESTERone (PROVERA) 10 MG tablet medroxyprogesterone 10 mg tablet    . metoprolol succinate (TOPROL-XL) 50 MG 24 hr tablet      No current facility-administered medications on file prior to visit.     Past Surgical History:  Procedure Laterality Date  . DILATION AND CURETTAGE OF UTERUS    . TONSILLECTOMY      Allergies  Allergen Reactions  . Codeine Shortness Of Breath and Rash  . Morphine And Related Palpitations    tachycardia    Social History   Socioeconomic History  . Marital status: Legally Separated    Spouse name: Not on file  . Number of children: Not on file  . Years of education: Not on file  . Highest education level: Not on file  Social Needs  . Financial resource strain: Not on file  . Food insecurity - worry: Not on file  . Food insecurity - inability: Not on  file  . Transportation needs - medical: Not on file  . Transportation needs - non-medical: Not on file  Occupational History  . Not on file  Tobacco Use  . Smoking status: Never Smoker  . Smokeless tobacco: Never Used  Substance and Sexual Activity  . Alcohol use: Yes  . Drug use: No  . Sexual activity: Not on file  Other Topics Concern  . Not on file  Social History Narrative  . Not on file    History reviewed. No pertinent family history.  BP (!) 132/98   Pulse 92   Ht 5\' 5"  (1.651 m)   Wt 218 lb (98.9 kg)   BMI 36.28 kg/m   Review of Systems: See HPI above.     Objective:  Physical Exam:  Gen: NAD, comfortable in exam room.  Left knee: No gross deformity, ecchymoses, effusion. Mild medial joint line tenderness.  No other tenderness. FROM with 5/5 strength. Negative ant/post drawers. Negative valgus/varus testing. Negative lachmanns. Negative mcmurrays, apleys, patellar apprehension. NV intact distally.  Right knee: No gross deformity, ecchymoses, swelling. No TTP. FROM with 5/5 strength. Negative ant/post drawers. Negative valgus/varus testing. Negative lachmanns. Negative mcmurrays, apleys, patellar apprehension. NV intact distally.  Left hip: No deformity. No tenderness. FROM with negative logroll. Mild pain with adduction only.  5/5 strength all directions. Negative piriformis. NVI distally.   Assessment & Plan:  1.  Bilateral knee pain - 2/2 contusions.  Much improved with only mild medial pain on left now.  Icing, ibuprofen only if needed.  Continue home exercises.  F/u in 4 weeks.  2. Left hip pain - 2/2 adductor strain.  Pain severity high with walking.  Rest of hip exam is reassuring and no new injuries.  Shown home exercises to do daily.  Icing, ibuprofen.  F/u in 4 weeks.  Consider imaging, physical therapy if not improving as expected.

## 2017-05-08 ENCOUNTER — Ambulatory Visit: Payer: Managed Care, Other (non HMO) | Admitting: Family Medicine

## 2017-07-31 DIAGNOSIS — F418 Other specified anxiety disorders: Secondary | ICD-10-CM

## 2017-07-31 DIAGNOSIS — K219 Gastro-esophageal reflux disease without esophagitis: Secondary | ICD-10-CM

## 2017-07-31 HISTORY — DX: Other specified anxiety disorders: F41.8

## 2017-07-31 HISTORY — DX: Gastro-esophageal reflux disease without esophagitis: K21.9

## 2018-10-20 DIAGNOSIS — R7301 Impaired fasting glucose: Secondary | ICD-10-CM

## 2018-10-20 HISTORY — DX: Impaired fasting glucose: R73.01

## 2019-09-13 ENCOUNTER — Emergency Department (HOSPITAL_BASED_OUTPATIENT_CLINIC_OR_DEPARTMENT_OTHER)
Admission: EM | Admit: 2019-09-13 | Discharge: 2019-09-13 | Disposition: A | Discharge status: Home / Self Care | Payer: BC Managed Care – PPO | Attending: Emergency Medicine | Admitting: Emergency Medicine

## 2019-09-13 ENCOUNTER — Emergency Department (HOSPITAL_BASED_OUTPATIENT_CLINIC_OR_DEPARTMENT_OTHER): Payer: BC Managed Care – PPO

## 2019-09-13 ENCOUNTER — Encounter (HOSPITAL_BASED_OUTPATIENT_CLINIC_OR_DEPARTMENT_OTHER): Payer: Self-pay

## 2019-09-13 ENCOUNTER — Other Ambulatory Visit: Payer: Self-pay

## 2019-09-13 DIAGNOSIS — S6992XA Unspecified injury of left wrist, hand and finger(s), initial encounter: Secondary | ICD-10-CM | POA: Diagnosis present

## 2019-09-13 DIAGNOSIS — Y92010 Kitchen of single-family (private) house as the place of occurrence of the external cause: Secondary | ICD-10-CM | POA: Insufficient documentation

## 2019-09-13 DIAGNOSIS — W260XXA Contact with knife, initial encounter: Secondary | ICD-10-CM | POA: Insufficient documentation

## 2019-09-13 DIAGNOSIS — Z7982 Long term (current) use of aspirin: Secondary | ICD-10-CM | POA: Diagnosis not present

## 2019-09-13 DIAGNOSIS — I10 Essential (primary) hypertension: Secondary | ICD-10-CM | POA: Diagnosis not present

## 2019-09-13 DIAGNOSIS — Y93G3 Activity, cooking and baking: Secondary | ICD-10-CM | POA: Diagnosis not present

## 2019-09-13 DIAGNOSIS — S61217A Laceration without foreign body of left little finger without damage to nail, initial encounter: Secondary | ICD-10-CM

## 2019-09-13 DIAGNOSIS — Z79899 Other long term (current) drug therapy: Secondary | ICD-10-CM | POA: Insufficient documentation

## 2019-09-13 DIAGNOSIS — Y999 Unspecified external cause status: Secondary | ICD-10-CM | POA: Diagnosis not present

## 2019-09-13 DIAGNOSIS — Z886 Allergy status to analgesic agent status: Secondary | ICD-10-CM | POA: Diagnosis not present

## 2019-09-13 MED ORDER — LIDOCAINE HCL (PF) 1 % IJ SOLN
10.0000 mL | Freq: Once | INTRAMUSCULAR | Status: AC
  Administered 2019-09-13: 10 mL
  Filled 2019-09-13: qty 10

## 2019-09-13 MED ORDER — TETANUS-DIPHTH-ACELL PERTUSSIS 5-2.5-18.5 LF-MCG/0.5 IM SUSP
0.5000 mL | Freq: Once | INTRAMUSCULAR | Status: AC
  Administered 2019-09-13: 0.5 mL via INTRAMUSCULAR
  Filled 2019-09-13: qty 0.5

## 2019-09-13 NOTE — ED Triage Notes (Signed)
Pt states she cut left pinky finger with kitchen knife ~30 min PTA-NAD-steady gait

## 2019-09-13 NOTE — Discharge Instructions (Signed)
1. Medications: Tylenol or ibuprofen for pain, usual home medications 2. Treatment: ice for swelling, keep wound clean with warm soap and water and keep bandage dry 3. Follow Up: Please return in 7 days to have your stitches removed or sooner if you have concerns. Return to the emergency department for increased redness, drainage of pus from the wound   WOUND CARE  Remove bandage and wash wound gently with mild soap and warm water daily. Reapply a new bandage after cleaning wound.   Continue daily cleansing with soap and water until stitches are removed.  Do not apply any ointments or creams to the wound while stitches are in place, as this may cause delayed healing. Return if you experience any of the following signs of infection: Swelling, redness, pus drainage, streaking, fever >101.0 F  Return if you experience excessive bleeding that does not stop after 15-20 minutes of constant, firm pressure.

## 2019-09-13 NOTE — ED Provider Notes (Signed)
[facility] EMERGENCY DEPARTMENT Provider Note   CSN: [id number] Arrival date & time: 09/13/19  1122     History Chief Complaint  Patient presents with  . Finger Injury    Stephanie Waters is a 59 y.o. female presenting for evaluation of finger injury.  Patient states just prior to arrival she was using a knife to separate frozen sausages when it slipped, and she cut her left pinky finger.  She applied pressure, but did not apply any ointments and did not clean the wound.  She denies injury elsewhere.  She does not know when her last tetanus shot was, thinks it might have been more than 10 years ago.  She denies numbness or tingling.  She has not taken anything for pain including Tylenol ibuprofen.  It is constant, worse with palpation.  Nothing makes it better or worse.  Does not radiate.  She reports a history of high blood pressure for which he takes medication, no other medical problems.  She is not on blood thinners.  HPI     Past Medical History:  Diagnosis Date  . Hypertension     Patient Active Problem List   Diagnosis Date Noted  . Left hip pain 04/09/2017  . Bilateral knee pain 03/18/2017  . Family history of heart disease 01/29/2017  . Severe obesity (BMI 35.0-39.9) with comorbidity (HCC) 01/24/2017  . Mixed hyperlipidemia 03/25/2016  . Vitamin D deficiency 07/17/2015  . Benign essential hypertension 06/07/2015    Past Surgical History:  Procedure Laterality Date  . DILATION AND CURETTAGE OF UTERUS    . TONSILLECTOMY       OB History   No obstetric history on file.     No family history on file.  Social History   Tobacco Use  . Smoking status: Never Smoker  . Smokeless tobacco: Never Used  Substance Use Topics  . Alcohol use: Yes    Comment: daily  . Drug use: No    Home Medications Prior to Admission medications   Medication Sig Start Date End Date Taking? Authorizing Provider  aspirin EC 81 MG tablet Take by mouth.    [provider]  losartan-hydrochlorothiazide Mauri Reading) 100-25 MG tablet  02/09/17   [provider]  medroxyPROGESTERone (PROVERA) 10 MG tablet medroxyprogesterone 10 mg tablet    [provider]  metoprolol succinate (TOPROL-XL) 50 MG 24 hr tablet  02/09/17   [provider]    Allergies    Codeine and Morphine and related  Review of Systems   Review of Systems  Skin: Positive for wound.  Neurological: Negative for numbness.    Physical Exam Updated Vital Signs BP (!) 142/101 (BP Location: Right Arm) Comment: no BP meds x 2 days  Pulse 90   Temp 98.1 F (36.7 C) (Oral)   Resp 18   Ht 5\' 5"  (1.651 m)   Wt 101.6 kg   SpO2 100%   BMI 37.28 kg/m   Physical Exam Vitals and nursing note reviewed.  Constitutional:      General: She is not in acute distress.    Appearance: She is well-developed.  HENT:     Head: Normocephalic and atraumatic.  Pulmonary:     Effort: Pulmonary effort is normal.  Abdominal:     General: There is no distension.  Musculoskeletal:        General: Normal range of motion.       Hands:     Cervical back: Normal range of motion.  Comments: Laceration of the left pinky finger between the DIP and PIP.  Does not overlie the joint.  Full active range of motion against resistance of the pinky.  Good distal sensation and cap refill.  Mild bleeding.  Skin:    General: Skin is warm.     Findings: No rash.  Neurological:     Mental Status: She is alert and oriented to person, place, and time.     ED Results / Procedures / Treatments   Labs (all labs ordered are listed, but only abnormal results are displayed) Labs Reviewed - No data to display  EKG None  Radiology DG Finger Little Left  Result Date: 09/13/2019 CLINICAL DATA:  Pt states she cut left pinky finger with kitchen knife earlier today, has laceration to distal and middle phalanx, no other complaints EXAM: LEFT LITTLE FINGER 2+V COMPARISON:  None. FINDINGS: Soft  tissue injury to radial side of the mid left fifth finger. No radiopaque foreign body. No fracture or bone lesion.  No joint abnormality. IMPRESSION: 1. No fracture, dislocation or radiopaque foreign body Electronically Signed   By: Lajean Manes M.D.   On: 09/13/2019 12:08    Procedures .Marland KitchenLaceration Repair  Date/Time: 09/13/2019 1:42 PM Performed by: Franchot Heidelberg, PA-C Authorized by: Franchot Heidelberg, PA-C   Consent:    Consent obtained:  Verbal   Consent given by:  Patient   Risks discussed:  Infection, need for additional repair, nerve damage, poor wound healing, poor cosmetic result, pain, retained foreign body, tendon damage and vascular damage Anesthesia (see MAR for exact dosages):    Anesthesia method:  Nerve block   Block location:  Left pinky finger   Block needle gauge:  25 G   Block anesthetic:  Lidocaine 1% w/o epi   Block technique:  Tendon sheath   Block injection procedure:  Negative aspiration for blood, introduced needle, incremental injection, anatomic landmarks identified and anatomic landmarks palpated   Block outcome:  Anesthesia achieved Laceration details:    Location:  Finger   Finger location:  L small finger   Length (cm):  1   Depth (mm):  3 Repair type:    Repair type:  Simple Pre-procedure details:    Preparation:  Patient was prepped and draped in usual sterile fashion and imaging obtained to evaluate for foreign bodies Exploration:    Hemostasis achieved with:  Direct pressure   Wound exploration: wound explored through full range of motion and entire depth of wound probed and visualized     Wound extent: no nerve damage noted, no tendon damage noted and no underlying fracture noted   Treatment:    Area cleansed with:  Shur-Clens and saline   Amount of cleaning:  Standard   Irrigation solution:  Sterile water   Irrigation method:  Syringe Skin repair:    Repair method:  Sutures   Suture size:  4-0   Suture material:  Prolene   Suture  technique:  Simple interrupted   Number of sutures:  3 Approximation:    Approximation:  Close Post-procedure details:    Dressing:  Bulky dressing   Patient tolerance of procedure:  Tolerated well, no immediate complications   (including critical care time)  Medications Ordered in ED Medications  Tdap (BOOSTRIX) injection 0.5 mL (0.5 mLs Intramuscular Given 09/13/19 1151)  lidocaine (PF) (XYLOCAINE) 1 % injection 10 mL (10 mLs Infiltration Given by Other 09/13/19 1151)    ED Course  I have reviewed the triage vital signs and  the nursing notes.  Pertinent labs & imaging results that were available during my care of the patient were reviewed by me and considered in my medical decision making (see chart for details).    MDM Rules/Calculators/A&P                      Patient presenting for evaluation of finger laceration.  On exam, patient appears nontoxic.  She is neurovascularly intact.  X-ray obtained to rule out fracture.  X-ray viewed interpreted by me, no fracture dislocation.  Laceration repaired as described above.  Wound cleaned and medicine to placement.  Discussed aftercare instructions.  Patient placed in a bulky dressing for protection.  At this time, patient appears safe for discharge.  Return precautions given.  Patient states she understands and agrees to plan.  Final Clinical Impression(s) / ED Diagnoses Final diagnoses:  Laceration of left little finger without foreign body without damage to nail, initial encounter    Rx / DC Orders ED Discharge Orders    None       Alveria Apley, PA-C 09/13/19 1344    Melene Plan, DO 09/13/19 1346

## 2019-09-21 ENCOUNTER — Other Ambulatory Visit: Payer: Self-pay

## 2019-09-21 ENCOUNTER — Emergency Department (HOSPITAL_BASED_OUTPATIENT_CLINIC_OR_DEPARTMENT_OTHER)
Admission: EM | Admit: 2019-09-21 | Discharge: 2019-09-21 | Disposition: A | Discharge status: Home / Self Care | Payer: BC Managed Care – PPO | Attending: Emergency Medicine | Admitting: Emergency Medicine

## 2019-09-21 ENCOUNTER — Encounter (HOSPITAL_BASED_OUTPATIENT_CLINIC_OR_DEPARTMENT_OTHER): Payer: Self-pay

## 2019-09-21 DIAGNOSIS — Z4802 Encounter for removal of sutures: Secondary | ICD-10-CM | POA: Diagnosis present

## 2019-09-21 DIAGNOSIS — I10 Essential (primary) hypertension: Secondary | ICD-10-CM | POA: Diagnosis not present

## 2019-09-21 MED ORDER — METHOCARBAMOL 500 MG PO TABS
500.0000 mg | ORAL_TABLET | Freq: Two times a day (BID) | ORAL | 0 refills | Status: DC
Start: 2019-09-21 — End: 2020-08-15

## 2019-09-21 MED ORDER — CELECOXIB 200 MG PO CAPS
200.0000 mg | ORAL_CAPSULE | Freq: Two times a day (BID) | ORAL | 0 refills | Status: DC
Start: 2019-09-21 — End: 2020-08-15
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
## 2019-09-21 NOTE — Discharge Instructions (Signed)
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
## 2020-07-24 ENCOUNTER — Encounter (HOSPITAL_BASED_OUTPATIENT_CLINIC_OR_DEPARTMENT_OTHER): Payer: Self-pay | Admitting: *Deleted

## 2020-07-24 ENCOUNTER — Other Ambulatory Visit: Payer: Self-pay

## 2020-07-24 ENCOUNTER — Emergency Department (HOSPITAL_BASED_OUTPATIENT_CLINIC_OR_DEPARTMENT_OTHER): Payer: BC Managed Care – PPO

## 2020-07-24 ENCOUNTER — Emergency Department (HOSPITAL_BASED_OUTPATIENT_CLINIC_OR_DEPARTMENT_OTHER)
Admission: EM | Admit: 2020-07-24 | Discharge: 2020-07-24 | Disposition: A | Discharge status: Home / Self Care | Payer: BC Managed Care – PPO | Attending: Emergency Medicine | Admitting: Emergency Medicine

## 2020-07-24 DIAGNOSIS — Z7982 Long term (current) use of aspirin: Secondary | ICD-10-CM | POA: Diagnosis not present

## 2020-07-24 DIAGNOSIS — I10 Essential (primary) hypertension: Secondary | ICD-10-CM | POA: Diagnosis not present

## 2020-07-24 DIAGNOSIS — R002 Palpitations: Secondary | ICD-10-CM | POA: Diagnosis present

## 2020-07-24 DIAGNOSIS — Z79899 Other long term (current) drug therapy: Secondary | ICD-10-CM | POA: Diagnosis not present

## 2020-07-24 DIAGNOSIS — R079 Chest pain, unspecified: Secondary | ICD-10-CM | POA: Insufficient documentation

## 2020-07-24 LAB — CBC WITH DIFFERENTIAL/PLATELET
Abs Immature Granulocytes: 0.01 10*3/uL (ref 0.00–0.07)
Basophils Absolute: 0 10*3/uL (ref 0.0–0.1)
Basophils Relative: 1 %
Eosinophils Absolute: 0.5 10*3/uL (ref 0.0–0.5)
Eosinophils Relative: 9 %
HCT: 39.9 % (ref 36.0–46.0)
Hemoglobin: 13 g/dL (ref 12.0–15.0)
Immature Granulocytes: 0 %
Lymphocytes Relative: 36 %
Lymphs Abs: 1.9 10*3/uL (ref 0.7–4.0)
MCH: 29.6 pg (ref 26.0–34.0)
MCHC: 32.6 g/dL (ref 30.0–36.0)
MCV: 90.9 fL (ref 80.0–100.0)
Monocytes Absolute: 0.4 10*3/uL (ref 0.1–1.0)
Monocytes Relative: 7 %
Neutro Abs: 2.5 10*3/uL (ref 1.7–7.7)
Neutrophils Relative %: 47 %
Platelets: 318 10*3/uL (ref 150–400)
RBC: 4.39 MIL/uL (ref 3.87–5.11)
RDW: 12.9 % (ref 11.5–15.5)
WBC: 5.3 10*3/uL (ref 4.0–10.5)
nRBC: 0 % (ref 0.0–0.2)

## 2020-07-24 LAB — COMPREHENSIVE METABOLIC PANEL
ALT: 24 U/L (ref 0–44)
AST: 22 U/L (ref 15–41)
Albumin: 3.8 g/dL (ref 3.5–5.0)
Alkaline Phosphatase: 58 U/L (ref 38–126)
Anion gap: 10 (ref 5–15)
BUN: 21 mg/dL — ABNORMAL HIGH (ref 6–20)
CO2: 26 mmol/L (ref 22–32)
Calcium: 9 mg/dL (ref 8.9–10.3)
Chloride: 98 mmol/L (ref 98–111)
Creatinine, Ser: 0.85 mg/dL (ref 0.44–1.00)
GFR, Estimated: >60 mL/min (ref >60)
Glucose, Bld: 98 mg/dL (ref 70–99)
Potassium: 3.5 mmol/L (ref 3.5–5.1)
Sodium: 134 mmol/L — ABNORMAL LOW (ref 135–145)
Total Bilirubin: 0.8 mg/dL (ref 0.3–1.2)
Total Protein: 7.8 g/dL (ref 6.5–8.1)

## 2020-07-24 LAB — TROPONIN I (HIGH SENSITIVITY)
Troponin I (High Sensitivity): 4 ng/L (ref <18)
Troponin I (High Sensitivity): 4 ng/L (ref <18)

## 2020-07-24 MED ORDER — HYDROCHLOROTHIAZIDE 25 MG PO TABS
25.0000 mg | ORAL_TABLET | Freq: Once | ORAL | Status: AC
  Administered 2020-07-24: 25 mg via ORAL
  Filled 2020-07-24: qty 1

## 2020-07-24 NOTE — ED Triage Notes (Signed)
She was seen today by her MD for palpitations that started a week ago. She had an EKG. She was told to come to the ER for further testing.

## 2020-07-24 NOTE — Discharge Instructions (Addendum)
  Please be sure to stay well-hydrated by drinking plenty of water. Follow-up with cardiology on this matter.  Call to make an appointment. Be sure to discuss your high blood pressure and any changes to high blood pressure medications with your primary care provider. Return to the emergency department for persistent chest pain, shortness of breath, dizziness, passing out, or any other major concerns.

## 2020-07-24 NOTE — ED Provider Notes (Signed)
[facility] EMERGENCY DEPARTMENT Provider Note   CSN: [id number] Arrival date & time: 07/24/20  1524     History No chief complaint on file.   Stephanie Waters is a 60 y.o. female.  HPI  HPI: A 60 year old patient with a history of hypertension and obesity presents for evaluation of chest pain. Initial onset of pain was more than 6 hours ago. The patient's chest pain is not worse with exertion. The patient's chest pain is middle- or left-sided, is not well-localized, is not described as heaviness/pressure/tightness, is not sharp and does not radiate to the arms/jaw/neck. The patient does not complain of nausea and denies diaphoresis. The patient has no history of stroke, has no history of peripheral artery disease, has not smoked in the past 90 days, denies any history of treated diabetes, has no relevant family history of coronary artery disease (first degree relative at less than age 36) and has no history of hypercholesterolemia.    Stephanie Waters is a 60 y.o. female, with a history of HTN, presenting to the ED with palpitations for the past week.  She has not noted any certain pattern to these palpitations. She has had palpitations in the past, however, the palpitations over the last week seem to be more frequent.  They last for less than a minute to a couple minutes at a time. She has intermittent chest discomfort, very mild, central chest, nonradiating, vague description, nonexertional.  Episodes of chest discomfort last for less than a minute or two. She was seen by her PCP today and asked to come to the emergency department for further evaluation. Endorses compliance with her hypertension medications.  Denies fever/chills, shortness of breath, dizziness, diaphoresis, N/V/D, abdominal pain, neurologic deficits, vision deficit, or any other complaints.   Past Medical History:  Diagnosis Date  . Hypertension     Patient Active Problem List   Diagnosis Date Noted  .  Left hip pain 04/09/2017  . Bilateral knee pain 03/18/2017  . Family history of heart disease 01/29/2017  . Severe obesity (BMI 35.0-39.9) with comorbidity (HCC) 01/24/2017  . Mixed hyperlipidemia 03/25/2016  . Vitamin D deficiency 07/17/2015  . Benign essential hypertension 06/07/2015    Past Surgical History:  Procedure Laterality Date  . DILATION AND CURETTAGE OF UTERUS    . TONSILLECTOMY       OB History   No obstetric history on file.     History reviewed. No pertinent family history.  Social History   Tobacco Use  . Smoking status: Never Smoker  . Smokeless tobacco: Never Used  Vaping Use  . Vaping Use: Never used  Substance Use Topics  . Alcohol use: Yes    Comment: daily  . Drug use: No    Home Medications Prior to Admission medications   Medication Sig Start Date End Date Taking? Authorizing Provider  aspirin EC 81 MG tablet Take by mouth.   Yes [provider]  losartan-hydrochlorothiazide (HYZAAR) 100-25 MG tablet Take 1 tablet by mouth daily. 02/09/17  Yes [provider]  metoprolol succinate (TOPROL-XL) 50 MG 24 hr tablet Take 50 mg by mouth daily. 02/09/17  Yes [provider]  celecoxib (CELEBREX) 200 MG capsule Take 1 capsule (200 mg total) by mouth 2 (two) times daily. 09/21/19   Arthor Captain, PA-C  medroxyPROGESTERone (PROVERA) 10 MG tablet medroxyprogesterone 10 mg tablet    [provider]  methocarbamol (ROBAXIN) 500 MG tablet Take 1 tablet (500 mg total) by mouth 2 (two) times daily.  09/21/19   Arthor Captain, PA-C    Allergies    Codeine and Morphine and related  Review of Systems   Review of Systems  Constitutional: Negative for chills, diaphoresis and fever.  Eyes: Negative for visual disturbance.  Respiratory: Negative for shortness of breath.   Cardiovascular: Positive for palpitations. Negative for leg swelling.  Gastrointestinal: Negative for abdominal pain, diarrhea, nausea and vomiting.   Neurological: Negative for dizziness, syncope, weakness, numbness and headaches.  All other systems reviewed and are negative.   Physical Exam Updated Vital Signs BP (!) 165/112   Pulse 62   Temp 98 F (36.7 C) (Oral)   Resp 17   Ht 5\' 5"  (1.651 m)   Wt 102 kg   SpO2 100%   BMI 37.42 kg/m   Physical Exam Vitals and nursing note reviewed.  Constitutional:      General: She is not in acute distress.    Appearance: She is well-developed. She is not diaphoretic.  HENT:     Head: Normocephalic and atraumatic.     Mouth/Throat:     Mouth: Mucous membranes are moist.     Pharynx: Oropharynx is clear.  Eyes:     Conjunctiva/sclera: Conjunctivae normal.  Cardiovascular:     Rate and Rhythm: Normal rate and regular rhythm.     Pulses: Normal pulses.          Radial pulses are 2+ on the right side and 2+ on the left side.       Posterior tibial pulses are 2+ on the right side and 2+ on the left side.     Heart sounds: Normal heart sounds.     Comments: Tactile temperature in the extremities appropriate and equal bilaterally. Pulmonary:     Effort: Pulmonary effort is normal. No respiratory distress.     Breath sounds: Normal breath sounds.  Abdominal:     Palpations: Abdomen is soft.     Tenderness: There is no abdominal tenderness. There is no guarding.  Musculoskeletal:     Cervical back: Neck supple.     Right lower leg: No edema.     Left lower leg: No edema.  Skin:    General: Skin is warm and dry.  Neurological:     Mental Status: She is alert and oriented to person, place, and time.     Comments: No noted acute cognitive deficit. Sensation grossly intact to light touch in the extremities.   Grip strengths equal bilaterally.   Strength 5/5 in all extremities.  No gait disturbance.  Coordination intact.  Cranial nerves III-XII grossly intact.  Handles oral secretions without noted difficulty.  No noted phonation or speech deficit. No facial droop.   Psychiatric:         Mood and Affect: Mood and affect normal.        Speech: Speech normal.        Behavior: Behavior normal.     ED Results / Procedures / Treatments   Labs (all labs ordered are listed, but only abnormal results are displayed) Labs Reviewed  COMPREHENSIVE METABOLIC PANEL - Abnormal; Notable for the following components:      Result Value   Sodium 134 (*)    BUN 21 (*)    All other components within normal limits  CBC WITH DIFFERENTIAL/PLATELET  TROPONIN I (HIGH SENSITIVITY)  TROPONIN I (HIGH SENSITIVITY)    EKG EKG Interpretation  Date/Time:  Monday July 24 2020 15:33:49 EDT Ventricular Rate:  63 PR Interval:  160 QRS Duration: 94 QT Interval:  432 QTC Calculation: 442 R Axis:   -4 Text Interpretation: Normal sinus rhythm Moderate voltage criteria for LVH, may be normal variant ( R in aVL , Cornell product ) Borderline ECG Confirmed by Gwyneth Sprout ([id number]) on 07/24/2020 4:00:51 PM   Radiology DG Chest 2 View  Result Date: 07/24/2020 CLINICAL DATA:  Chest pain EXAM: CHEST - 2 VIEW COMPARISON:  09/07/2014 FINDINGS: Cardiac shadow is within normal limits. The lungs are well aerated bilaterally. No focal infiltrate or sizable effusion is seen. No bony abnormality is noted. IMPRESSION: No active cardiopulmonary disease. Electronically Signed   By: Alcide Clever M.D.   On: 07/24/2020 17:51    Procedures Procedures   Medications Ordered in ED Medications  hydrochlorothiazide (HYDRODIURIL) tablet 25 mg (25 mg Oral Given 07/24/20 1726)    ED Course  I have reviewed the triage vital signs and the nursing notes.  Pertinent labs & imaging results that were available during my care of the patient were reviewed by me and considered in my medical decision making (see chart for details).    MDM Rules/Calculators/A&P HEAR Score: 2                        Patient presents with episodes of palpitations intermittently over the last week. Patient is nontoxic appearing,  afebrile, not tachycardic, not tachypneic, not hypotensive, maintains excellent SPO2 on room air, and is in no apparent distress.   I have reviewed the patient's chart to obtain more information.   I reviewed and interpreted the patient's labs and radiological studies. Patient had no incidences of palpitations or chest discomfort during her ED course. Low suspicion for ACS. No high risk/suspicious features; no exertional chest pain, vomiting, diaphoresis, or radiation. HEART score is 2, indicating low risk for a cardiac event.  EKG without evidence of acute ischemia or pathologic/symptomatic arrhythmia.  Delta troponins negative. Wells criteria score is 0, indicating low risk for PE.   Dissection was considered, but thought less likely base on: History and description of the pain are not suggestive, patient is not ill-appearing, lack of risk factors, equal bilateral pulses, lack of neurologic deficits, no widened mediastinum on chest x-ray.  Patient advised to follow-up with cardiology.   Blood pressure is elevated, however, patient currently asymptomatic.  Low suspicion for hypertensive emergency.  Some improvement was noted with additional dose of hydrochlorothiazide here in the ED.  Patient will need to work with her PCP for further management of her hypertension.  Final Clinical Impression(s) / ED Diagnoses Final diagnoses:  Palpitations    Rx / DC Orders ED Discharge Orders    None       Concepcion Living 07/24/20 2120    Gwyneth Sprout, MD 07/25/20 [phone]

## 2020-08-10 ENCOUNTER — Other Ambulatory Visit: Payer: Self-pay

## 2020-08-15 ENCOUNTER — Encounter: Payer: Self-pay | Admitting: Cardiology

## 2020-08-15 ENCOUNTER — Other Ambulatory Visit: Payer: Self-pay

## 2020-08-15 ENCOUNTER — Ambulatory Visit (INDEPENDENT_AMBULATORY_CARE_PROVIDER_SITE_OTHER): Payer: BC Managed Care – PPO | Admitting: Cardiology

## 2020-08-15 VITALS — BP 150/104 | HR 76 | Ht 65.0 in | Wt 208.1 lb

## 2020-08-15 DIAGNOSIS — I10 Essential (primary) hypertension: Secondary | ICD-10-CM | POA: Diagnosis not present

## 2020-08-15 DIAGNOSIS — R0789 Other chest pain: Secondary | ICD-10-CM | POA: Diagnosis not present

## 2020-08-15 DIAGNOSIS — R7301 Impaired fasting glucose: Secondary | ICD-10-CM | POA: Diagnosis not present

## 2020-08-15 DIAGNOSIS — E782 Mixed hyperlipidemia: Secondary | ICD-10-CM | POA: Diagnosis not present

## 2020-08-15 NOTE — Progress Notes (Signed)
Cardiology Consultation:    Date:  08/15/2020   ID:  Stephanie Waters, DOB [birth year], MRN [id number]  PCP:  [facility]  Cardiologist:  Gypsy Balsam, MD   Referring MD: [facility]   No chief complaint on file. Have high blood pressure and chest pain  History of Present Illness:    Stephanie Waters is a 60 y.o. female who is being seen today for the evaluation of hypertension at the request of [facility]  Past medical history significant for essential hypertension.  She has been hypertensive for years she has been taking all different medications.  Recently blood pressure became somewhat difficult to control on top of that she started complaining of having chest pain.  Chest pain is very atypical nature shortness pinches like located in the left side of her chest lasting only for seconds to minutes not related to exercise.  There is no shortness of breath there is no sweating associated with this sensation.  At the same time she goes to gym on the regular basis she goes few times a week and she can do elliptical and treadmill with weightlifting and have no difficulty doing it.  She lost some weight.  Problem her to go back to exercise is the fact that her fasting glucose became elevated and she is trying to fight possibility of development of diabetes.  And she is successful with this.  She does have dyslipidemia however recently had cholesterol-lowering medication being stopped because apparently her cholesterol was good.  She quit smoking many years ago when she was pregnant, she does not have family history of premature coronary artery disease.  Her father apparently died because of cardiac arrest but he was in his 46s.  She is not on any special diet but she does exercise on the regular basis successfully.  There is no dizziness no passing out no swelling of lower extremities   Past Medical History:  Diagnosis Date  . Essential  hypertension 06/07/2015   Last Assessment & Plan:  BP is elevated today however has been out of medication x2 weeks, will restart previous medications.  Previous labwork from 06/2015 reviewed via care everywhere.  Repeat at next follow up.    . Family history of heart disease 01/29/2017  . Gastroesophageal reflux disease without esophagitis 07/31/2017  . Hypertension   . Impaired fasting blood sugar 10/20/2018  . Left hip pain 04/09/2017  . Mixed hyperlipidemia 03/25/2016  . Severe obesity (BMI 35.0-39.9) with comorbidity (HCC) 01/24/2017  . Situational anxiety 07/31/2017  . Vitamin D deficiency 07/17/2015    Past Surgical History:  Procedure Laterality Date  . DILATION AND CURETTAGE OF UTERUS    . TONSILLECTOMY      Current Medications: Current Meds  Medication Sig  . ALPRAZolam (XANAX) 0.25 MG tablet Take 0.25 mg by mouth 2 (two) times daily as needed for anxiety.  Marland Kitchen aspirin EC 81 MG tablet Take by mouth.  . hydrochlorothiazide (HYDRODIURIL) 50 MG tablet Take 50 mg by mouth daily.  Marland Kitchen losartan (COZAAR) 100 MG tablet Take 1 tablet by mouth daily.  . Metoprolol Succinate 50 MG CS24 Take 50 mg by mouth daily.  . Vitamin D, Ergocalciferol, (DRISDOL) 1.25 MG (50000 UNIT) CAPS capsule Take 1 capsule by mouth once a week.     Allergies:   Codeine and Morphine and related   Social History   Socioeconomic History  . Marital status: Legally Separated    Spouse name: Not on file  .  Number of children: Not on file  . Years of education: Not on file  . Highest education level: Not on file  Occupational History  . Not on file  Tobacco Use  . Smoking status: Never Smoker  . Smokeless tobacco: Never Used  Vaping Use  . Vaping Use: Never used  Substance and Sexual Activity  . Alcohol use: Yes    Comment: daily  . Drug use: No  . Sexual activity: Not on file  Other Topics Concern  . Not on file  Social History Narrative  . Not on file   Social Determinants of Health   Financial  Resource Strain: Not on file  Food Insecurity: Not on file  Transportation Needs: Not on file  Physical Activity: Not on file  Stress: Not on file  Social Connections: Not on file     Family History: The patient's family history includes Diabetes in her mother; Heart attack in her father; Heart disease in her maternal grandmother; Hypertension in her mother and sister. ROS:   Please see the history of present illness.    All 14 point review of systems negative except as described per history of present illness.  EKGs/Labs/Other Studies Reviewed:    The following studies were reviewed today: Normal sinus rhythm moderate criteria for LVH, borderline EKG.    Recent Labs: 07/24/2020: ALT 24; BUN 21; Creatinine, Ser 0.85; Hemoglobin 13.0; Platelets 318; Potassium 3.5; Sodium 134  Recent Lipid Panel No results found for: CHOL, TRIG, HDL, CHOLHDL, VLDL, LDLCALC, LDLDIRECT  Physical Exam:    VS:  BP (!) 150/104 (BP Location: Left Arm, Patient Position: Sitting)   Pulse 76   Ht 5\' 5"  (1.651 m)   Wt 208 lb 1.9 oz (94.4 kg)   SpO2 99%   BMI 34.63 kg/m     Wt Readings from Last 3 Encounters:  08/15/20 208 lb 1.9 oz (94.4 kg)  07/24/20 224 lb 13.9 oz (102 kg)  09/21/19 224 lb 12.8 oz (102 kg)     GEN:  Well nourished, well developed in no acute distress HEENT: Normal NECK: No JVD; No carotid bruits LYMPHATICS: No lymphadenopathy CARDIAC: RRR, no murmurs, no rubs, no gallops RESPIRATORY:  Clear to auscultation without rales, wheezing or rhonchi  ABDOMEN: Soft, non-tender, non-distended MUSCULOSKELETAL:  No edema; No deformity  SKIN: Warm and dry NEUROLOGIC:  Alert and oriented x 3 PSYCHIATRIC:  Normal affect   ASSESSMENT:    1. Essential hypertension   2. Impaired fasting blood sugar   3. Mixed hyperlipidemia   4. Atypical chest pain    PLAN:    In order of problems listed above:  1. Essential hypertension obviously concerning blood pressure elevated today in the  office but that is a first visit in my office.  I will ask her to get new blood pressure monitor and start checking blood pressure at home I recommended the one on the shoulder/arm rather than on the wrist.  I asked her to take note of her blood pressure and bring to me next time.  I asked her to take blood pressure at different times of the day and rest about 5 minutes before checking her blood pressure.  Also she will be scheduled to have an echocardiogram to assess left ventricle ejection fraction as well as look for left ventricle hypertrophy which undoubtedly will find. 2. Impaired blood sugar.  Hemoglobin A1c in February 2021 was 6.0 however she started exercising on the regular basis and hemoglobin A1c went down to  5.4 in February of this year. 3. Dyslipidemia: Her LDL is 144 HDL 47. 4. Will calculate her 10 years risk and then will decide about potentially doing statin which I think she can benefit from. 5. Atypical chest pain really very atypical not exercise related pinches like.  Interestingly in April 18 she ended going to the emergency room because of atypical chest pain all work-up has been negative.  She did not mention anything to me about this pain.  Again she is able to exercise on the treadmill elliptical she has no difficulty no chest pain.   Medication Adjustments/Labs and Tests Ordered: Current medicines are reviewed at length with the patient today.  Concerns regarding medicines are outlined above.  No orders of the defined types were placed in this encounter.  No orders of the defined types were placed in this encounter.   Signed, Georgeanna Lea, MD, [facility]. 08/15/2020 4:14 PM    [facility]

## 2020-08-15 NOTE — Patient Instructions (Signed)
Medication Instructions:  Your physician recommends that you continue on your current medications as directed. Please refer to the Current Medication list given to you today.  *If you need a refill on your cardiac medications before your next appointment, please call your pharmacy*   Lab Work: None If you have labs (blood work) drawn today and your tests are completely normal, you will receive your results only by: MyChart Message (if you have MyChart) OR A paper copy in the mail If you have any lab test that is abnormal or we need to change your treatment, we will call you to review the results.   Testing/Procedures: Your physician has requested that you have an echocardiogram. Echocardiography is a painless test that uses sound waves to create images of your heart. It provides your doctor with information about the size and shape of your heart and how well your heart's chambers and valves are working. This procedure takes approximately one hour. There are no restrictions for this procedure.    Follow-Up: At [facility], you and your health needs are our priority.  As part of our continuing mission to provide you with exceptional heart care, we have created designated Provider Care Teams.  These Care Teams include your primary Cardiologist (physician) and Advanced Practice Providers (APPs -  Physician Assistants and Nurse Practitioners) who all work together to provide you with the care you need, when you need it.  We recommend signing up for the patient portal called "MyChart".  Sign up information is provided on this After Visit Summary.  MyChart is used to connect with patients for Virtual Visits (Telemedicine).  Patients are able to view lab/test results, encounter notes, upcoming appointments, etc.  Non-urgent messages can be sent to your provider as well.   To learn more about what you can do with MyChart, go to [email or web address].    Your next appointment:   2  month(s)  The format for your next appointment:   In Person  Provider:   Robert Krasowski, MD   Other Instructions   

## 2020-08-29 ENCOUNTER — Other Ambulatory Visit: Payer: Self-pay

## 2020-08-29 ENCOUNTER — Emergency Department (HOSPITAL_BASED_OUTPATIENT_CLINIC_OR_DEPARTMENT_OTHER): Payer: BC Managed Care – PPO

## 2020-08-29 ENCOUNTER — Emergency Department (HOSPITAL_BASED_OUTPATIENT_CLINIC_OR_DEPARTMENT_OTHER)
Admission: EM | Admit: 2020-08-29 | Discharge: 2020-08-29 | Disposition: A | Discharge status: Home / Self Care | Payer: BC Managed Care – PPO | Attending: Emergency Medicine | Admitting: Emergency Medicine

## 2020-08-29 ENCOUNTER — Encounter (HOSPITAL_BASED_OUTPATIENT_CLINIC_OR_DEPARTMENT_OTHER): Payer: Self-pay | Admitting: Emergency Medicine

## 2020-08-29 DIAGNOSIS — J3489 Other specified disorders of nose and nasal sinuses: Secondary | ICD-10-CM | POA: Diagnosis present

## 2020-08-29 DIAGNOSIS — Z79899 Other long term (current) drug therapy: Secondary | ICD-10-CM | POA: Insufficient documentation

## 2020-08-29 DIAGNOSIS — Z7982 Long term (current) use of aspirin: Secondary | ICD-10-CM | POA: Diagnosis not present

## 2020-08-29 DIAGNOSIS — I10 Essential (primary) hypertension: Secondary | ICD-10-CM | POA: Insufficient documentation

## 2020-08-29 DIAGNOSIS — U071 COVID-19: Secondary | ICD-10-CM | POA: Insufficient documentation

## 2020-08-29 LAB — RESP PANEL BY RT-PCR (FLU A&B, COVID) ARPGX2
Influenza A by PCR: NEGATIVE
Influenza B by PCR: NEGATIVE
SARS Coronavirus 2 by RT PCR: POSITIVE — AB

## 2020-08-29 MED ORDER — ACETAMINOPHEN 325 MG PO TABS
650.0000 mg | ORAL_TABLET | Freq: Once | ORAL | Status: AC
  Administered 2020-08-29: 650 mg via ORAL
  Filled 2020-08-29: qty 2

## 2020-08-29 NOTE — ED Provider Notes (Signed)
[facility] EMERGENCY DEPARTMENT Provider Note   CSN: [id number] Arrival date & time: 08/29/20  5035     History Chief Complaint  Patient presents with  . Fever    Stephanie Waters is a 60 y.o. female with PMH of HTN who presents the ED with 4-day history of cold symptoms.  Patient reports that over the weekend she developed some rhinorrhea and sinus congestion.  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  She also lives with her foster daughter who has been asymptomatic.  Yesterday she developed generalized body aches and in the middle of the night checked her temperature and she was febrile to 102 F.  She also is endorsing a nonproductive cough and diminished appetite.  On the way here she developed mild nausea symptoms, but denies any emesis.  She also had a single episode of loose, nonbloody stool this morning.  She denies any chest pain, difficulty breathing, emesis, dysuria or other urinary symptoms, vaginal discharge or bleeding, sore throat symptoms, difficulty eating or drinking, or other symptoms.  HPI     Past Medical History:  Diagnosis Date  . Essential hypertension 06/07/2015   Last Assessment & Plan:  BP is elevated today however has been out of medication x2 weeks, will restart previous medications.  Previous labwork from 06/2015 reviewed via care everywhere.  Repeat at next follow up.    . Family history of heart disease 01/29/2017  . Gastroesophageal reflux disease without esophagitis 07/31/2017  . Hypertension   . Impaired fasting blood sugar 10/20/2018  . Left hip pain 04/09/2017  . Mixed hyperlipidemia 03/25/2016  . Severe obesity (BMI 35.0-39.9) with comorbidity (HCC) 01/24/2017  . Situational anxiety 07/31/2017  . Vitamin D deficiency 07/17/2015    Patient Active Problem List   Diagnosis Date Noted  . Atypical chest pain 08/15/2020  . Impaired fasting blood sugar 10/20/2018  . Gastroesophageal reflux disease without esophagitis  07/31/2017  . Situational anxiety 07/31/2017  . Left hip pain 04/09/2017  . Bilateral knee pain 03/18/2017  . Family history of heart disease 01/29/2017  . Severe obesity (BMI 35.0-39.9) with comorbidity (HCC) 01/24/2017  . Mixed hyperlipidemia 03/25/2016  . Vitamin D deficiency 07/17/2015  . Essential hypertension 06/07/2015    Past Surgical History:  Procedure Laterality Date  . DILATION AND CURETTAGE OF UTERUS    . TONSILLECTOMY       OB History   No obstetric history on file.     Family History  Problem Relation Age of Onset  . Hypertension Mother   . Diabetes Mother   . Heart attack Father   . Hypertension Sister   . Heart disease Maternal Grandmother     Social History   Tobacco Use  . Smoking status: Never Smoker  . Smokeless tobacco: Never Used  Vaping Use  . Vaping Use: Never used  Substance Use Topics  . Alcohol use: Yes    Comment: daily  . Drug use: No    Home Medications Prior to Admission medications   Medication Sig Start Date End Date Taking? Authorizing Provider  ALPRAZolam (XANAX) 0.25 MG tablet Take 0.25 mg by mouth 2 (two) times daily as needed for anxiety. 05/15/20   [provider]  aspirin EC 81 MG tablet Take by mouth.    [provider]  hydrochlorothiazide (HYDRODIURIL) 50 MG tablet Take 50 mg by mouth daily. 08/14/20   [provider]  losartan (COZAAR) 100 MG tablet Take 1 tablet by mouth  daily. 05/10/20   [provider]  Metoprolol Succinate 50 MG CS24 Take 50 mg by mouth daily.    [provider]  Vitamin D, Ergocalciferol, (DRISDOL) 1.25 MG (50000 UNIT) CAPS capsule Take 1 capsule by mouth once a week. 04/05/20   [provider]    Allergies    Codeine and Morphine and related  Review of Systems   Review of Systems  All other systems reviewed and are negative.   Physical Exam Updated Vital Signs BP 140/76   Pulse 82   Temp (!) 101.3 F (38.5 C)   Resp 15   Ht 5\' 5"   (1.651 m)   Wt 94.3 kg   SpO2 94%   BMI 34.61 kg/m   Physical Exam Vitals and nursing note reviewed. Exam conducted with a chaperone present.  Constitutional:      General: She is not in acute distress.    Appearance: She is ill-appearing. She is not toxic-appearing.  HENT:     Head: Normocephalic and atraumatic.     Nose: Congestion present.  Eyes:     General: No scleral icterus.    Conjunctiva/sclera: Conjunctivae normal.  Cardiovascular:     Rate and Rhythm: Normal rate and regular rhythm.     Pulses: Normal pulses.     Heart sounds: Normal heart sounds.  Pulmonary:     Effort: Pulmonary effort is normal. No respiratory distress.     Breath sounds: Normal breath sounds. No wheezing or rales.     Comments: CTA bilaterally.  No increased work of breathing.  100% on room air. Abdominal:     General: Abdomen is flat. There is no distension.     Palpations: Abdomen is soft.     Tenderness: There is no abdominal tenderness. There is no guarding.     Comments: Soft, nondistended.  No areas of tenderness.  Musculoskeletal:     Cervical back: Normal range of motion. No rigidity.     Right lower leg: No edema.     Left lower leg: No edema.  Skin:    General: Skin is dry.  Neurological:     Mental Status: She is alert and oriented to person, place, and time.     GCS: GCS eye subscore is 4. GCS verbal subscore is 5. GCS motor subscore is 6.  Psychiatric:        Mood and Affect: Mood normal.        Behavior: Behavior normal.        Thought Content: Thought content normal.     ED Results / Procedures / Treatments   Labs (all labs ordered are listed, but only abnormal results are displayed) Labs Reviewed  RESP PANEL BY RT-PCR (FLU A&B, COVID) ARPGX2 - Abnormal; Notable for the following components:      Result Value   SARS Coronavirus 2 by RT PCR POSITIVE (*)    All other components within normal limits    EKG None  Radiology DG Chest Portable 1 View  Result Date:  08/29/2020 CLINICAL DATA:  Cough and shortness of breath EXAM: PORTABLE CHEST 1 VIEW COMPARISON:  07/24/2020 FINDINGS: The heart size and mediastinal contours are within normal limits. Both lungs are clear. The visualized skeletal structures are unremarkable. IMPRESSION: No active disease. Electronically Signed   By: 07/26/2020 M.D.   On: 08/29/2020 11:31    Procedures Procedures   Medications Ordered in ED Medications  acetaminophen (TYLENOL) tablet 650 mg (650 mg Oral Given 08/29/20 1030)  ED Course  I have reviewed the triage vital signs and the nursing notes.  Pertinent labs & imaging results that were available during my care of the patient were reviewed by me and considered in my medical decision making (see chart for details).    MDM Rules/Calculators/A&P                          Stephanie Waters was evaluated in Emergency Department on 08/29/2020 for the symptoms described in the history of present illness. She was evaluated in the context of the global COVID-19 pandemic, which necessitated consideration that the patient might be at risk for infection with the SARS-CoV-2 virus that causes COVID-19. Institutional protocols and algorithms that pertain to the evaluation of patients at risk for COVID-19 are in a state of rapid change based on information released by regulatory bodies including the CDC and federal and state organizations. These policies and algorithms were followed during the patient's care in the ED.  I personally reviewed patient's medical chart and all notes from triage and staff during today's encounter. I have also ordered and reviewed all labs and imaging that I felt to be medically necessary in the evaluation of this patient's complaints and with consideration of their physical exam. If needed, translation services were available and utilized.   Patient with symptoms consistent with viral illness for 4 days.  COVID-19 and influenza test is obtained and pending.   Patient is noted to be febrile here in the ED to 101.3 F.  Last Tylenol was at 3 AM this morning.  We will give an additional 650 mg.  Chest x-ray is obtained, personally reviewed, and without any acute cardiopulmonary disease.  Their symptoms are likely of viral etiology and we discussed that antibiotics are not indicated for viral infections.  Patient will be discharged with symptomatic treatment.  Patient is tolerating food and liquid without difficulty and I do not believe that laboratory work-up would yield any significant findings.  Low suspicion for electrolyte derangement, but emphasized the importance of eating regularly.  I also emphasized the importance of rest, continued oral hydration, and antipyretics as needed for fever control.    They were provided opportunity to ask any additional questions and have none at this time.  Prior to discharge patient is feeling well, agreeable with plan for discharge home.  They have expressed understanding of verbal discharge instructions as well as return precautions and are agreeable to the plan.    Final Clinical Impression(s) / ED Diagnoses Final diagnoses:  COVID-19    Rx / DC Orders ED Discharge Orders    None       Lorelee New, PA-C 08/29/20 1135    Virgina Norfolk, DO 08/29/20 1214

## 2020-08-29 NOTE — Discharge Instructions (Addendum)
You tested positive for COVID-19.  Please follow-up with your primary care provider for ongoing evaluation and management.  Continue to maintain isolation precautions for the next 2 days before returning to work.  At that time, if you are still experiencing fevers, do not return for another 5 days.  Otherwise, he may return to work with a mask.  Please check your temperature regularly and take ibuprofen and Tylenol as needed for fever control.  Please increase oral hydration.  Return to the ED or seek immediate medical attention should you experience any new or worsening symptoms.

## 2020-08-29 NOTE — ED Triage Notes (Signed)
Pt arrives pov, ambulatory with c/o runny nose that started Saturday. Pt reports fever, cough, generalized body aches and decreased appetite that started last night. 1g tylenol at 0300 today

## 2020-08-29 NOTE — ED Notes (Signed)
Upon provider assessment, no orders for IV access or labs obtained.

## 2020-08-30 ENCOUNTER — Telehealth: Payer: Self-pay | Admitting: Unknown Physician Specialty

## 2020-08-30 NOTE — Telephone Encounter (Signed)
Called to discuss with patient about COVID-19 symptoms and the use of one of the available treatments for those with mild to moderate Covid symptoms and at a high risk of hospitalization.  Pt appears to qualify for outpatient treatment due to co-morbid conditions and/or a member of an at-risk group in accordance with the FDA Emergency Use Authorization.    Symptom onset:5/23 Vaccinated: ? Booster?  IQualifiers: ethnicity, obesity, htn   Unable to reach pt - Mailbox is full.  No mychart   Gabriel Cirri

## 2020-09-12 ENCOUNTER — Other Ambulatory Visit (HOSPITAL_COMMUNITY): Payer: BC Managed Care – PPO

## 2020-10-11 ENCOUNTER — Ambulatory Visit (HOSPITAL_BASED_OUTPATIENT_CLINIC_OR_DEPARTMENT_OTHER): Payer: BC Managed Care – PPO | Attending: Cardiology

## 2020-10-23 ENCOUNTER — Ambulatory Visit: Payer: BC Managed Care – PPO | Admitting: Cardiology

## 2021-08-16 ENCOUNTER — Emergency Department (HOSPITAL_BASED_OUTPATIENT_CLINIC_OR_DEPARTMENT_OTHER)
Admission: EM | Admit: 2021-08-16 | Discharge: 2021-08-16 | Disposition: A | Discharge status: Home / Self Care | Payer: BC Managed Care – PPO | Attending: Emergency Medicine | Admitting: Emergency Medicine

## 2021-08-16 ENCOUNTER — Other Ambulatory Visit: Payer: Self-pay

## 2021-08-16 ENCOUNTER — Emergency Department (HOSPITAL_BASED_OUTPATIENT_CLINIC_OR_DEPARTMENT_OTHER): Payer: BC Managed Care – PPO

## 2021-08-16 ENCOUNTER — Encounter (HOSPITAL_BASED_OUTPATIENT_CLINIC_OR_DEPARTMENT_OTHER): Payer: Self-pay

## 2021-08-16 DIAGNOSIS — Z7982 Long term (current) use of aspirin: Secondary | ICD-10-CM | POA: Insufficient documentation

## 2021-08-16 DIAGNOSIS — Z79899 Other long term (current) drug therapy: Secondary | ICD-10-CM | POA: Insufficient documentation

## 2021-08-16 DIAGNOSIS — M25512 Pain in left shoulder: Secondary | ICD-10-CM | POA: Diagnosis not present

## 2021-08-16 DIAGNOSIS — R0789 Other chest pain: Secondary | ICD-10-CM | POA: Diagnosis not present

## 2021-08-16 DIAGNOSIS — R079 Chest pain, unspecified: Secondary | ICD-10-CM | POA: Diagnosis present

## 2021-08-16 DIAGNOSIS — R1013 Epigastric pain: Secondary | ICD-10-CM | POA: Diagnosis not present

## 2021-08-16 DIAGNOSIS — I10 Essential (primary) hypertension: Secondary | ICD-10-CM | POA: Diagnosis not present

## 2021-08-16 LAB — BASIC METABOLIC PANEL
Anion gap: 5 (ref 5–15)
BUN: 26 mg/dL — ABNORMAL HIGH (ref 8–23)
CO2: 25 mmol/L (ref 22–32)
Calcium: 8.6 mg/dL — ABNORMAL LOW (ref 8.9–10.3)
Chloride: 107 mmol/L (ref 98–111)
Creatinine, Ser: 1.02 mg/dL — ABNORMAL HIGH (ref 0.44–1.00)
GFR, Estimated: >60 mL/min (ref >60)
Glucose, Bld: 124 mg/dL — ABNORMAL HIGH (ref 70–99)
Potassium: 3.5 mmol/L (ref 3.5–5.1)
Sodium: 137 mmol/L (ref 135–145)

## 2021-08-16 LAB — TROPONIN I (HIGH SENSITIVITY)
Troponin I (High Sensitivity): 4 ng/L (ref <18)
Troponin I (High Sensitivity): 4 ng/L (ref <18)

## 2021-08-16 LAB — CBC
HCT: 38.3 % (ref 36.0–46.0)
Hemoglobin: 12.7 g/dL (ref 12.0–15.0)
MCH: 29.4 pg (ref 26.0–34.0)
MCHC: 33.2 g/dL (ref 30.0–36.0)
MCV: 88.7 fL (ref 80.0–100.0)
Platelets: 285 10*3/uL (ref 150–400)
RBC: 4.32 MIL/uL (ref 3.87–5.11)
RDW: 12.8 % (ref 11.5–15.5)
WBC: 5.8 10*3/uL (ref 4.0–10.5)
nRBC: 0 % (ref 0.0–0.2)

## 2021-08-16 NOTE — ED Notes (Signed)
Pt ambulated to bathroom with steady gait. Denies any pain or discomfort at this time.  ?

## 2021-08-16 NOTE — ED Triage Notes (Signed)
Patient states she woke up at 630am today with throbbing left sided chest pain - Patient states pain is no longer in her chest but now radiates down to her left arm.  ?Denies shortness of breath - Patient speaking in full sentences.  ?

## 2021-08-16 NOTE — Discharge Instructions (Addendum)
It was a pleasure taking care of you today.  I am not quite sure what is causing your chest pain but we have done a thorough evaluation of your heart and found no abnormalities.  It may be related to reflux so please be sure to take your omeprazole.  For your left arm pain you can take Tylenol.  If your symptoms return or worsen please be reevaluated.  I hope you have a great afternoon! ? ?

## 2021-08-16 NOTE — ED Provider Notes (Signed)
?[facility] EMERGENCY DEPARTMENT ?Provider Note ? ? ?CSN: [id number] ?Arrival date & time: 08/16/21  4696 ? ?  ? ?History ? ?Chief Complaint  ?Patient presents with  ? Chest Pain  ? Arm Pain  ? ? ?Stephanie Waters is a 61 y.o. female. ? ?Patient is a 61 year old female with past medical history of hypertension, hyperlipidemia who presents after waking up this morning with a "throbbing pain" in her left upper shoulder which radiated down her left arm.  She reports that it migrated to her epigastric region and "felt like reflux".  She reports that the pain in her epigastric area has resolved but she is still feeling a throbbing pain in her left arm.  Denies any cardiac history although reports she has had chest pain in the past and went to the emergency department and saw cardiology after.  Has not taken any medication for the pain.  Her main medications are metoprolol, Cozaar, hydrochlorothiazide.  Denies any shortness of breath, nausea, cough, congestion, rhinorrhea, vomiting, constipation.  Reports some recent diarrhea. ? ? ?  ? ?Home Medications ?Prior to Admission medications   ?Medication Sig Start Date End Date Taking? Authorizing Provider  ?ALPRAZolam (XANAX) 0.25 MG tablet Take 0.25 mg by mouth 2 (two) times daily as needed for anxiety. 05/15/20   [provider]  ?aspirin EC 81 MG tablet Take by mouth.    [provider]  ?hydrochlorothiazide (HYDRODIURIL) 50 MG tablet Take 50 mg by mouth daily. 08/14/20   [provider]  ?losartan (COZAAR) 100 MG tablet Take 1 tablet by mouth daily. 05/10/20   [provider]  ?Metoprolol Succinate 50 MG CS24 Take 50 mg by mouth daily.    [provider]  ?Vitamin D, Ergocalciferol, (DRISDOL) 1.25 MG (50000 UNIT) CAPS capsule Take 1 capsule by mouth once a week. 04/05/20   [provider]  ?   ? ?Allergies    ?Codeine and Morphine and related   ? ?Review of Systems   ?Review of Systems  ?Constitutional:  Negative for  fever.  ?HENT:  Negative for congestion.   ?Respiratory:  Negative for cough, chest tightness and shortness of breath.   ?Cardiovascular:  Positive for chest pain.  ?Gastrointestinal:  Positive for diarrhea. Negative for constipation, nausea and vomiting.  ?Genitourinary:  Negative for difficulty urinating.  ?Musculoskeletal:  Negative for arthralgias.  ?Neurological:  Negative for dizziness, light-headedness and headaches.  ?All other systems reviewed and are negative. ? ?Physical Exam ?Updated Vital Signs ?BP 122/82   Pulse 61   Temp 98.2 ?F (36.8 ?C) (Oral)   Resp 20   Ht 5\' 5"  (1.651 m)   Wt 104.3 kg   SpO2 100%   BMI 38.27 kg/m?  ?Physical Exam ?Vitals and nursing note reviewed.  ?Constitutional:   ?   General: She is not in acute distress. ?   Appearance: She is well-developed.  ?HENT:  ?   Head: Normocephalic and atraumatic.  ?Eyes:  ?   Conjunctiva/sclera: Conjunctivae normal.  ?Cardiovascular:  ?   Rate and Rhythm: Normal rate and regular rhythm.  ?   Heart sounds: No murmur heard. ?Pulmonary:  ?   Effort: Pulmonary effort is normal. No respiratory distress.  ?   Breath sounds: Normal breath sounds.  ?Abdominal:  ?   Palpations: Abdomen is soft.  ?   Tenderness: There is no abdominal tenderness.  ?Musculoskeletal:     ?   General: No swelling.  ?   Cervical back: Neck  supple.  ?Skin: ?   General: Skin is warm and dry.  ?   Capillary Refill: Capillary refill takes less than 2 seconds.  ?Neurological:  ?   Mental Status: She is alert.  ?Psychiatric:     ?   Mood and Affect: Mood normal.  ? ? ?ED Results / Procedures / Treatments   ?Labs ?(all labs ordered are listed, but only abnormal results are displayed) ?Labs Reviewed  ?BASIC METABOLIC PANEL - Abnormal; Notable for the following components:  ?    Result Value  ? Glucose, Bld 124 (*)   ? BUN 26 (*)   ? Creatinine, Ser 1.02 (*)   ? Calcium 8.6 (*)   ? All other components within normal limits  ?CBC  ?TROPONIN I (HIGH SENSITIVITY)  ?TROPONIN I (HIGH  SENSITIVITY)  ? ? ?EKG ?EKG Interpretation ? ?Date/Time:  Thursday Aug 16 2021 10:07:23 EDT ?Ventricular Rate:  80 ?PR Interval:  161 ?QRS Duration: 91 ?QT Interval:  388 ?QTC Calculation: 448 ?R Axis:   7 ?Text Interpretation: Sinus rhythm LVH with secondary repolarization abnormality No significant change since last tracing Confirmed by Jacalyn Lefevre [phone]) on 08/16/2021 10:18:06 AM ? ?Radiology ?DG Chest 2 View ? ?Result Date: 08/16/2021 ?CLINICAL DATA:  chest pain. Patient woke up today at 6:30 a.m. with throbbing left-sided chest pain radiating down the left arm EXAM: CHEST - 2 VIEW COMPARISON:  July 12, 2021 FINDINGS: The heart size and mediastinal contours are within normal limits. Thoracic aorta is ectatic. Both lungs are clear and stable. The visualized skeletal structures are unremarkable. IMPRESSION: No active cardiopulmonary disease. Electronically Signed   By: Marjo Bicker M.D.   On: 08/16/2021 11:01   ? ?Procedures ?Procedures  ? ?Medications Ordered in ED ?Medications - No data to display ? ?ED Course/ Medical Decision Making/ A&P ?  ?                        ?Medical Decision Making ?Patient presenting with acute chest pain.  Differential includes ACS, acid reflux, costochondritis.  On arrival vital signs within normal limit.  EKG showing normal sinus rhythm.  CBC within normal limits.  Physical exam reassuring with regular heart rate, no murmurs appreciated, no lower extremity edema.  Chest pain was not reproducible.  ? ?Chest x-ray showing no active cardiopulmonary disease.  Initial troponin within normal limits.  Repeat troponin within normal limits as well.  Reevaluated the patient who reported no chest pain but was still having small amount of left arm pain.  Unclear etiology for this.  Discussed making sure she is taking anti-GERD medications and she can take Tylenol for the left arm pain.  Strict return precautions given.  Patient discharged home. ? ?Amount and/or Complexity of Data  Reviewed ?Labs: ordered. ?Radiology: ordered. ? ? ? ?Final Clinical Impression(s) / ED Diagnoses ?Final diagnoses:  ?Atypical chest pain  ? ? ?Rx / DC Orders ?ED Discharge Orders   ? ? None  ? ?  ? ? ?  ?Derrel Nip, MD ?08/16/21 1311 ? ?  ?Jacalyn Lefevre, MD ?08/16/21 1418 ? ?

## 2023-01-08 IMAGING — CR DG CHEST 2V
2 series · 2 of 2 positions shown · non-contrast
Comparison: July 12, 2021

CLINICAL DATA: chest pain. Patient woke up today at [DATE] a.m. with
throbbing left-sided chest pain radiating down the left arm

EXAM:
CHEST - 2 VIEW

[w chest pa]
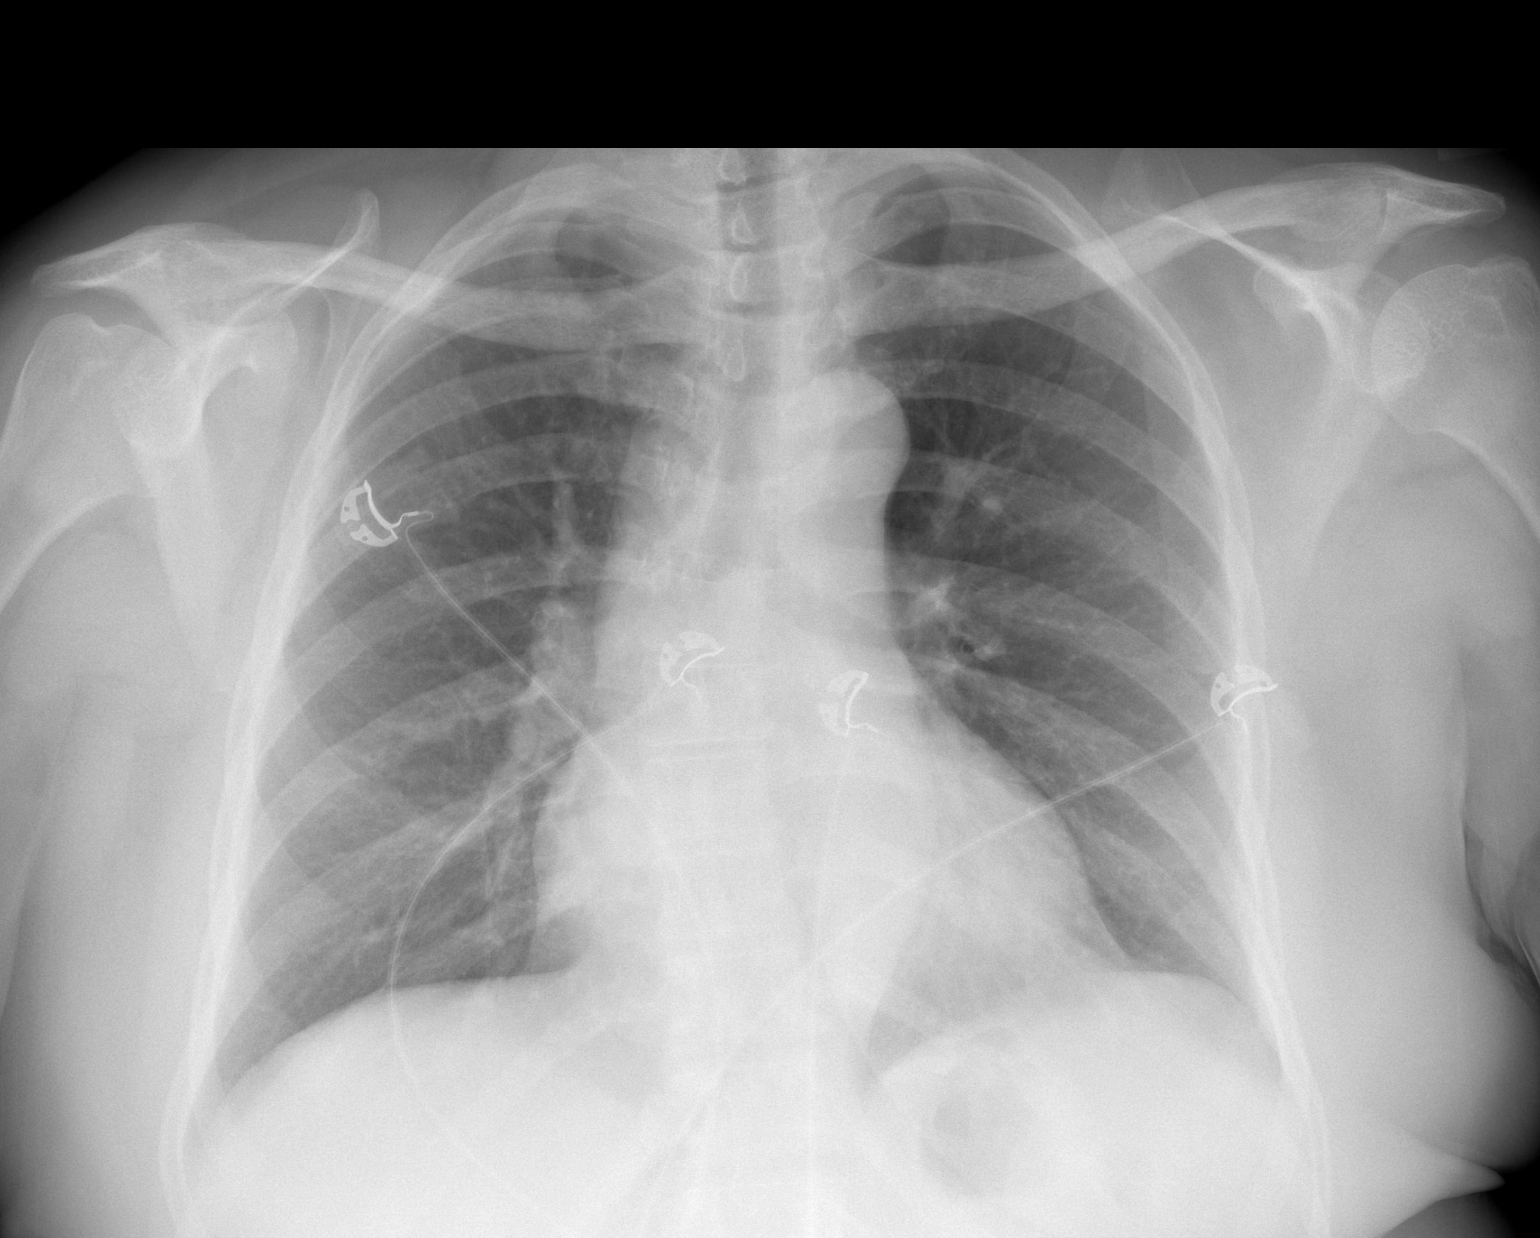

[w chest lat]
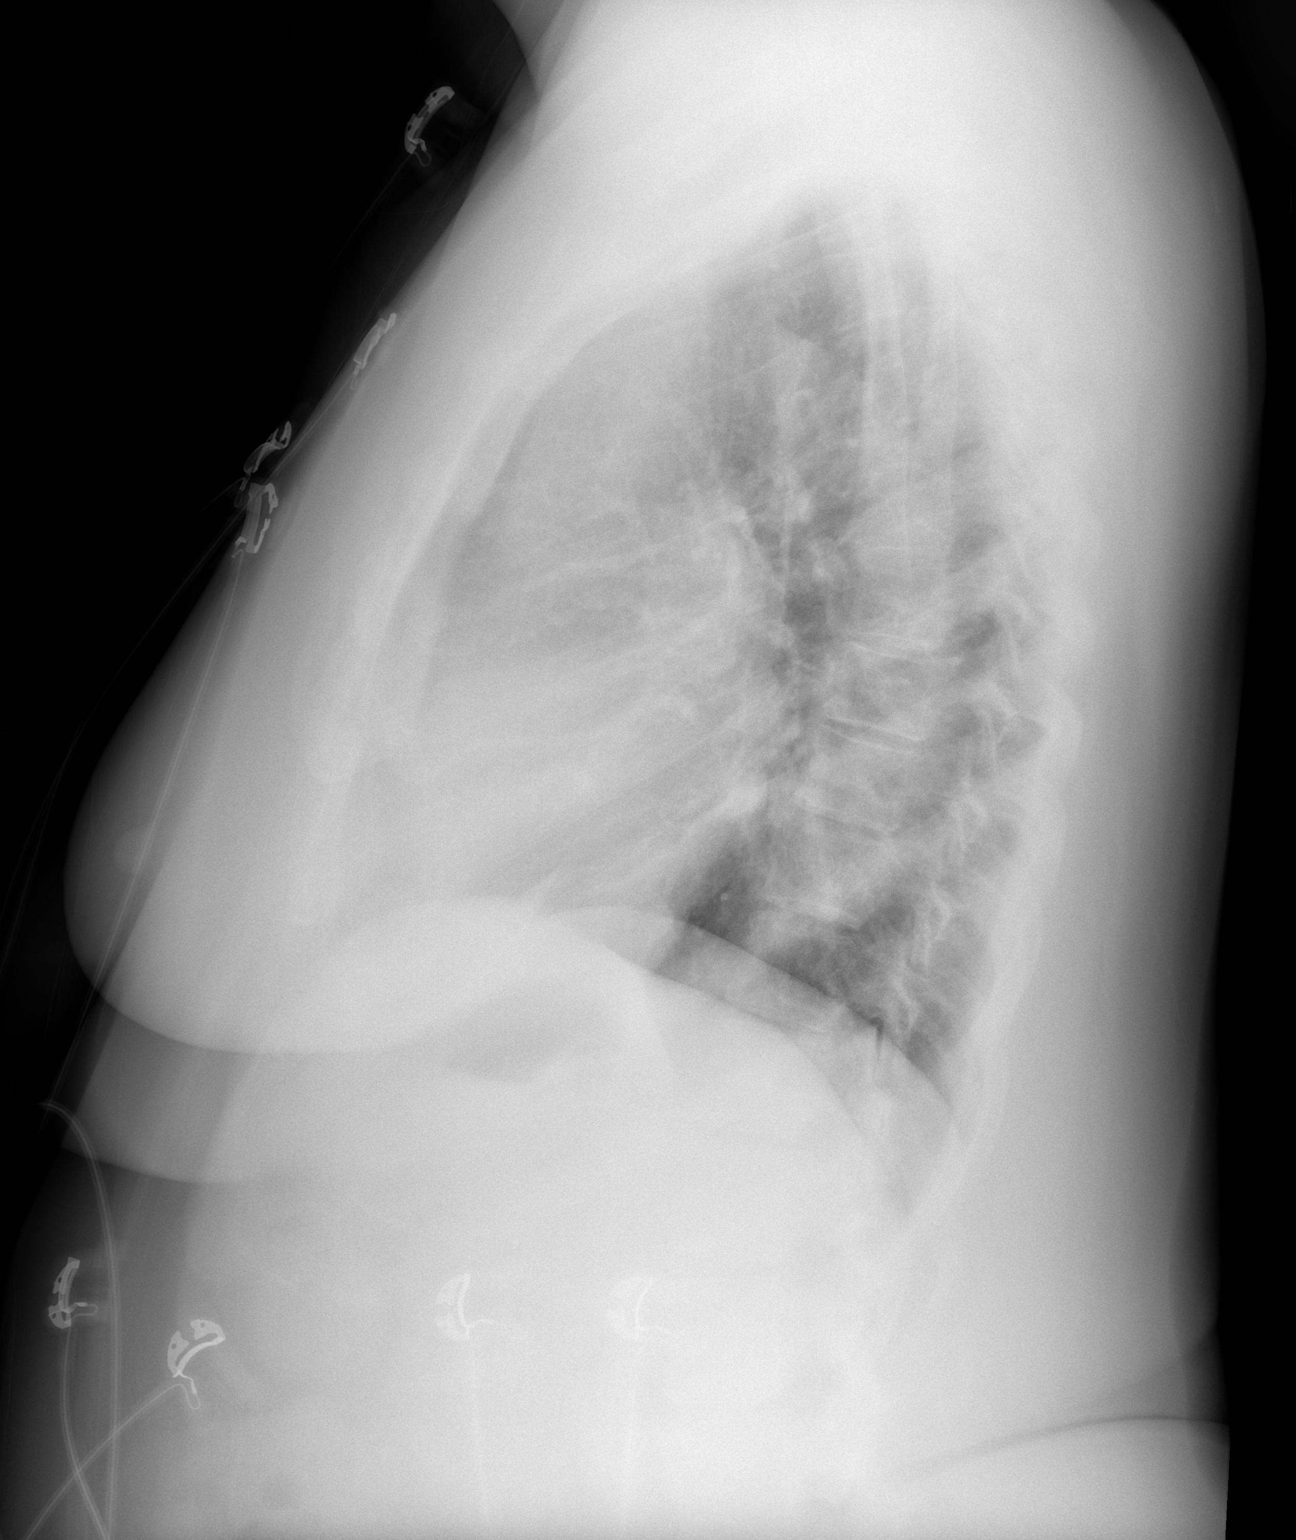

[2 of 2 positions shown; findings below may reference images not displayed]

FINDINGS: The heart size and mediastinal contours are within normal limits.
Thoracic aorta is ectatic. Both lungs are clear and stable. The
visualized skeletal structures are unremarkable.
IMPRESSION: No active cardiopulmonary disease.
